# Patient Record
Sex: Female | Born: 1940
Health system: Southern US, Community
[De-identification: ages and names within clinical notes are randomized; demographics above are authoritative.]

## PROBLEM LIST (undated history)

## (undated) DIAGNOSIS — C801 Malignant (primary) neoplasm, unspecified: Secondary | ICD-10-CM

## (undated) DIAGNOSIS — C50919 Malignant neoplasm of unspecified site of unspecified female breast: Secondary | ICD-10-CM

## (undated) DIAGNOSIS — M1990 Unspecified osteoarthritis, unspecified site: Secondary | ICD-10-CM

## (undated) DIAGNOSIS — K219 Gastro-esophageal reflux disease without esophagitis: Secondary | ICD-10-CM

## (undated) DIAGNOSIS — K529 Noninfective gastroenteritis and colitis, unspecified: Secondary | ICD-10-CM

## (undated) DIAGNOSIS — K589 Irritable bowel syndrome without diarrhea: Secondary | ICD-10-CM

## (undated) HISTORY — PX: ABDOMINAL HYSTERECTOMY: SHX81

## (undated) HISTORY — PX: BREAST SURGERY: SHX581

## (undated) HISTORY — PX: UPPER GI ENDOSCOPY: SHX6162

## (undated) HISTORY — PX: MELANOMA EXCISION: SHX5266

## (undated) HISTORY — PX: TONSILLECTOMY: SUR1361

## (undated) HISTORY — PX: COLONOSCOPY: SHX174

## (undated) HISTORY — DX: Malignant neoplasm of unspecified site of unspecified female breast: C50.919

---

## 1988-05-12 HISTORY — PX: MASTECTOMY: SHX3

## 1998-09-14 ENCOUNTER — Other Ambulatory Visit: Admission: RE | Admit: 1998-09-14 | Discharge: 1998-09-14 | Payer: Self-pay | Admitting: Gynecology

## 1999-12-12 ENCOUNTER — Other Ambulatory Visit: Admission: RE | Admit: 1999-12-12 | Discharge: 1999-12-12 | Payer: Self-pay | Admitting: Gynecology

## 2001-03-17 ENCOUNTER — Ambulatory Visit (HOSPITAL_COMMUNITY): Admission: RE | Admit: 2001-03-17 | Discharge: 2001-03-17 | Payer: Self-pay | Admitting: General Surgery

## 2001-03-17 ENCOUNTER — Encounter: Payer: Self-pay | Admitting: General Surgery

## 2001-06-15 ENCOUNTER — Other Ambulatory Visit: Admission: RE | Admit: 2001-06-15 | Discharge: 2001-06-15 | Payer: Self-pay | Admitting: General Surgery

## 2001-06-24 ENCOUNTER — Other Ambulatory Visit: Admission: RE | Admit: 2001-06-24 | Discharge: 2001-06-24 | Payer: Self-pay | Admitting: Gynecology

## 2002-02-27 ENCOUNTER — Emergency Department (HOSPITAL_COMMUNITY): Admission: EM | Admit: 2002-02-27 | Discharge: 2002-02-27 | Payer: Self-pay | Admitting: Emergency Medicine

## 2002-05-18 ENCOUNTER — Ambulatory Visit (HOSPITAL_COMMUNITY): Admission: RE | Admit: 2002-05-18 | Discharge: 2002-05-18 | Payer: Self-pay | Admitting: General Surgery

## 2002-05-18 ENCOUNTER — Encounter: Payer: Self-pay | Admitting: General Surgery

## 2003-01-19 ENCOUNTER — Ambulatory Visit (HOSPITAL_COMMUNITY): Admission: RE | Admit: 2003-01-19 | Discharge: 2003-01-19 | Payer: Self-pay | Admitting: Internal Medicine

## 2003-01-20 ENCOUNTER — Encounter: Payer: Self-pay | Admitting: Internal Medicine

## 2003-05-25 ENCOUNTER — Encounter (INDEPENDENT_AMBULATORY_CARE_PROVIDER_SITE_OTHER): Payer: Self-pay | Admitting: *Deleted

## 2003-05-25 ENCOUNTER — Ambulatory Visit (HOSPITAL_COMMUNITY): Admission: RE | Admit: 2003-05-25 | Discharge: 2003-05-25 | Payer: Self-pay | Admitting: General Surgery

## 2003-05-25 ENCOUNTER — Ambulatory Visit (HOSPITAL_BASED_OUTPATIENT_CLINIC_OR_DEPARTMENT_OTHER): Admission: RE | Admit: 2003-05-25 | Discharge: 2003-05-25 | Payer: Self-pay | Admitting: General Surgery

## 2003-06-05 ENCOUNTER — Ambulatory Visit (HOSPITAL_COMMUNITY): Admission: RE | Admit: 2003-06-05 | Discharge: 2003-06-05 | Payer: Self-pay | Admitting: Internal Medicine

## 2003-06-19 ENCOUNTER — Other Ambulatory Visit: Admission: RE | Admit: 2003-06-19 | Discharge: 2003-06-19 | Payer: Self-pay | Admitting: Gynecology

## 2003-07-27 ENCOUNTER — Encounter (INDEPENDENT_AMBULATORY_CARE_PROVIDER_SITE_OTHER): Payer: Self-pay | Admitting: General Surgery

## 2003-07-27 ENCOUNTER — Ambulatory Visit (HOSPITAL_BASED_OUTPATIENT_CLINIC_OR_DEPARTMENT_OTHER): Admission: RE | Admit: 2003-07-27 | Discharge: 2003-07-27 | Payer: Self-pay | Admitting: General Surgery

## 2003-07-27 ENCOUNTER — Ambulatory Visit (HOSPITAL_COMMUNITY): Admission: RE | Admit: 2003-07-27 | Discharge: 2003-07-27 | Payer: Self-pay | Admitting: General Surgery

## 2003-11-24 ENCOUNTER — Ambulatory Visit (HOSPITAL_COMMUNITY): Admission: RE | Admit: 2003-11-24 | Discharge: 2003-11-24 | Payer: Self-pay | Admitting: Internal Medicine

## 2004-06-03 ENCOUNTER — Ambulatory Visit: Payer: Self-pay | Admitting: Internal Medicine

## 2004-06-26 ENCOUNTER — Emergency Department (HOSPITAL_COMMUNITY): Admission: EM | Admit: 2004-06-26 | Discharge: 2004-06-26 | Payer: Self-pay | Admitting: Emergency Medicine

## 2004-06-27 ENCOUNTER — Encounter: Admission: RE | Admit: 2004-06-27 | Discharge: 2004-06-27 | Payer: Self-pay | Admitting: Urology

## 2004-07-03 ENCOUNTER — Ambulatory Visit (HOSPITAL_BASED_OUTPATIENT_CLINIC_OR_DEPARTMENT_OTHER): Admission: RE | Admit: 2004-07-03 | Discharge: 2004-07-03 | Payer: Self-pay | Admitting: Urology

## 2004-07-03 ENCOUNTER — Ambulatory Visit (HOSPITAL_COMMUNITY): Admission: RE | Admit: 2004-07-03 | Discharge: 2004-07-03 | Payer: Self-pay | Admitting: Urology

## 2004-07-22 ENCOUNTER — Ambulatory Visit (HOSPITAL_COMMUNITY): Admission: RE | Admit: 2004-07-22 | Discharge: 2004-07-22 | Payer: Self-pay | Admitting: Urology

## 2004-07-29 ENCOUNTER — Ambulatory Visit: Payer: Self-pay | Admitting: Internal Medicine

## 2004-12-10 ENCOUNTER — Ambulatory Visit (HOSPITAL_COMMUNITY): Admission: RE | Admit: 2004-12-10 | Discharge: 2004-12-10 | Payer: Self-pay | Admitting: Internal Medicine

## 2005-04-22 ENCOUNTER — Ambulatory Visit (HOSPITAL_COMMUNITY): Admission: RE | Admit: 2005-04-22 | Discharge: 2005-04-22 | Payer: Self-pay | Admitting: Internal Medicine

## 2005-04-22 ENCOUNTER — Ambulatory Visit: Payer: Self-pay | Admitting: Internal Medicine

## 2005-04-22 ENCOUNTER — Encounter (INDEPENDENT_AMBULATORY_CARE_PROVIDER_SITE_OTHER): Payer: Self-pay | Admitting: Internal Medicine

## 2005-06-16 ENCOUNTER — Ambulatory Visit: Payer: Self-pay | Admitting: Orthopedic Surgery

## 2005-06-25 ENCOUNTER — Ambulatory Visit: Payer: Self-pay | Admitting: Internal Medicine

## 2005-10-02 ENCOUNTER — Other Ambulatory Visit: Admission: RE | Admit: 2005-10-02 | Discharge: 2005-10-02 | Payer: Self-pay | Admitting: Gynecology

## 2005-11-17 ENCOUNTER — Encounter (HOSPITAL_COMMUNITY): Admission: RE | Admit: 2005-11-17 | Discharge: 2005-11-18 | Payer: Self-pay | Admitting: Internal Medicine

## 2005-12-11 ENCOUNTER — Ambulatory Visit (HOSPITAL_COMMUNITY): Admission: RE | Admit: 2005-12-11 | Discharge: 2005-12-11 | Payer: Self-pay | Admitting: Internal Medicine

## 2007-01-25 ENCOUNTER — Ambulatory Visit (HOSPITAL_COMMUNITY): Admission: RE | Admit: 2007-01-25 | Discharge: 2007-01-25 | Payer: Self-pay | Admitting: Internal Medicine

## 2007-03-09 ENCOUNTER — Ambulatory Visit (HOSPITAL_COMMUNITY): Admission: RE | Admit: 2007-03-09 | Discharge: 2007-03-09 | Payer: Self-pay | Admitting: Internal Medicine

## 2008-01-27 ENCOUNTER — Ambulatory Visit (HOSPITAL_COMMUNITY): Admission: RE | Admit: 2008-01-27 | Discharge: 2008-01-27 | Payer: Self-pay | Admitting: Internal Medicine

## 2009-01-30 ENCOUNTER — Ambulatory Visit (HOSPITAL_COMMUNITY): Admission: RE | Admit: 2009-01-30 | Discharge: 2009-01-30 | Payer: Self-pay | Admitting: Internal Medicine

## 2010-01-25 ENCOUNTER — Ambulatory Visit: Payer: Self-pay | Admitting: Cardiology

## 2010-01-25 DIAGNOSIS — K589 Irritable bowel syndrome without diarrhea: Secondary | ICD-10-CM

## 2010-01-25 DIAGNOSIS — M129 Arthropathy, unspecified: Secondary | ICD-10-CM | POA: Insufficient documentation

## 2010-01-25 DIAGNOSIS — C50919 Malignant neoplasm of unspecified site of unspecified female breast: Secondary | ICD-10-CM | POA: Insufficient documentation

## 2010-01-25 DIAGNOSIS — K580 Irritable bowel syndrome with diarrhea: Secondary | ICD-10-CM | POA: Insufficient documentation

## 2010-01-25 DIAGNOSIS — R002 Palpitations: Secondary | ICD-10-CM | POA: Insufficient documentation

## 2010-01-25 DIAGNOSIS — R079 Chest pain, unspecified: Secondary | ICD-10-CM | POA: Insufficient documentation

## 2010-01-25 DIAGNOSIS — Z9010 Acquired absence of unspecified breast and nipple: Secondary | ICD-10-CM

## 2010-01-29 ENCOUNTER — Encounter: Payer: Self-pay | Admitting: Cardiology

## 2010-01-29 ENCOUNTER — Ambulatory Visit: Payer: Self-pay | Admitting: Cardiology

## 2010-01-29 ENCOUNTER — Ambulatory Visit (HOSPITAL_COMMUNITY): Admission: RE | Admit: 2010-01-29 | Discharge: 2010-01-29 | Payer: Self-pay | Admitting: Cardiology

## 2010-02-01 ENCOUNTER — Ambulatory Visit (HOSPITAL_COMMUNITY)
Admission: RE | Admit: 2010-02-01 | Discharge: 2010-02-01 | Payer: Self-pay | Source: Home / Self Care | Admitting: Internal Medicine

## 2010-04-29 ENCOUNTER — Encounter (INDEPENDENT_AMBULATORY_CARE_PROVIDER_SITE_OTHER): Payer: Self-pay

## 2010-06-02 ENCOUNTER — Encounter: Payer: Self-pay | Admitting: Internal Medicine

## 2010-06-11 ENCOUNTER — Ambulatory Visit (HOSPITAL_COMMUNITY)
Admission: RE | Admit: 2010-06-11 | Discharge: 2010-06-11 | Payer: Self-pay | Source: Home / Self Care | Attending: Internal Medicine | Admitting: Internal Medicine

## 2010-06-11 NOTE — Assessment & Plan Note (Signed)
Summary: np6/ rapid heart rate. palps/ pt has secure horizone. gd   Visit Type:  new pt visit Referring Provider:  Valera Castle Primary Provider:  Carylon Perches  CC:  palpitations....pt states she had an episode of heat stroke back in the summer while getting ready to play golf....pt c/o chest tightness...denies any sob or edema...says her activity level is still the same and is eating well..says she is not sleeping well lately.  History of Present Illness: Sonya [name] comes in today self referred by her husband for some palpitations and chest discomfort.  She is 70 years of age and has no cardiac history. She is extremely health-conscious. She did smoke for a number of years but quit in 1988.  This summer, while playing golf, she apparently got dehydrated. She describes what happened later was a vasovagal syncopal event. It took her several days to count recover from this. This was not facilitated by chest pain or any symptoms of tachycardia or palpitations.  Ever since then, she has intermittent sporadic brief palpitations. There no other associated symptoms. She also occasionally has some aching across her left chest. She has an old shoulder injury on the left. This discomfort is not related to exertion. She has no other symptoms of shortness of breath, diaphoresis or palpitations with the chest pain. It happens fairly infrequent.  Her cardiac risk factors are significant for age and remote tobacco. Her cholesterol numbers by Dr. Ouida Sills show a total of 191, HDL 78, total cholesterol today she'll ratio 2.4, triglycerides were normal LDL was 94. LFTs are normal.  Her blood work also showed a normal potassium, normal fasting blood sugar. I do not see a TSH. She denies any symptoms of hyperthyroidism.  Preventive Screening-Counseling & Management  Alcohol-Tobacco     Smoking Status: quit  Caffeine-Diet-Exercise     Does Patient Exercise: yes      Drug Use:  no.    Current Medications  (verified): 1)  Aspirin 81 Mg Tbec (Aspirin) .... Take One Tablet By Mouth Daily 2)  Evista 60 Mg Tabs (Raloxifene Hcl) .Marland Kitchen.. 1 Tab Once Daily  Allergies (verified): 1)  ! Celebrex  Past History:  Family History: Last updated: 01/25/2010 Mother: deceaed @ 80 due to cancer Father: deceased @ 44 due to alzheimers  Social History: Last updated: 01/25/2010 Tobacco Use - Former.  smoked for 20 yrs ...quit 1988 Married  Alcohol Use - yes..2 glasses per night Drug Use - no Regular Exercise - yes...golf, walking, swimming  Risk Factors: Exercise: yes (01/25/2010)  Risk Factors: Smoking Status: quit (01/25/2010)  Past Medical History: Chest pains Palpatations Heat stroke in July 2011 Allergies  Past Surgical History:  radical mastectomy  a hysterectomy and unilateral oophorectomy in 1990, with a  hemorrhoidectomy, and she has had a previous bladder tack.  Cysto, left retrograde pyelogram and insertion of left ureteral  stent. Tonsillectomy Adenenoidectomy Hemorrhoidectomy  Family History: Mother: deceaed @ 31 due to cancer Father: deceased @ 76 due to alzheimers  Social History: Tobacco Use - Former.  smoked for 20 yrs ...quit 1988 Married  Alcohol Use - yes..2 glasses per night Drug Use - no Regular Exercise - yes...golf, walking, swimming Smoking Status:  quit Drug Use:  no Does Patient Exercise:  yes  Review of Systems       negative other than history of present illness  Vital Signs:  Patient profile:   70 year old female Height:      65 inches Weight:  137.8 pounds BMI:     23.01 Pulse rate:   71 / minute Pulse rhythm:   irregular BP sitting:   116 / 80  (right arm) Cuff size:   large  Vitals Entered By: Danielle Rankin, CMA (January 25, 2010 12:31 PM)  Physical Exam  General:  Well developed, well nourished, in no acute distress. Head:  normocephalic and atraumatic Eyes:  PERRLA/EOM intact; conjunctiva and lids normal. Neck:  Neck supple,  no JVD. No masses, thyromegaly or abnormal cervical nodes. Chest Wall:  no deformities or breast masses noted Lungs:  Clear bilaterally to auscultation and percussion. Heart:  Non-displaced PMI, chest non-tender; regular rate and rhythm, S1, S2 without murmurs, rubs or gallops. Carotid upstroke normal, no bruit. Normal abdominal aortic size, no bruits. Femorals normal pulses, no bruits. Pedals normal pulses. No edema, no varicosities. Abdomen:  Bowel sounds positive; abdomen soft and non-tender without masses, organomegaly, or hernias noted. No hepatosplenomegaly. Msk:  Back normal, normal gait. Muscle strength and tone normal. Pulses:  pulses normal in all 4 extremities Extremities:  No clubbing or cyanosis. Neurologic:  Alert and oriented x 3. Skin:  Intact without lesions or rashes. Psych:  Normal affect.   Problems:  Medical Problems Added: 1)  Dx of Chest Pain  (ICD-786.50) 2)  Dx of Palpitations  (ICD-785.1) 3)  Dx of Arthritis  (ICD-716.90) 4)  Dx of Ibs  (ICD-564.1) 5)  Dx of Mastectomy, Left, Hx of  (ICD-V45.71) 6)  Dx of Adenocarcinoma, Left Breast  (ICD-174.9)  EKG  Procedure date:  01/25/2010  Findings:      normal sinus rhythm, normal EKG except for some slight poor progression anterior precordium. P-R QRS and QTC intervals are normal  Impression & Recommendations:  Problem # 1:  CHEST PAIN (ICD-786.50) I do not think this is cardiac. I suspect is musculoskeletal somehow associated with her left shoulder discomfort. I have reviewed the symptoms of angina or ischemic equivalence. I encouraged continued exercise and continue her heart healthy lifestyle. Her updated medication list for this problem includes:    Aspirin 81 Mg Tbec (Aspirin) .Marland Kitchen... Take one tablet by mouth daily  Orders: EKG w/ Interpretation (93000)  Problem # 2:  PALPITATIONS (ICD-785.1) I suspect these are benign with a fairly benign history, normal exam, normal EKG. We'll arrange a 2-D  echocardiogram. This is normal reassurance and be given. Her updated medication list for this problem includes:    Aspirin 81 Mg Tbec (Aspirin) .Marland Kitchen... Take one tablet by mouth daily  Orders: EKG w/ Interpretation (93000) Echocardiogram (Echo)  Patient Instructions: 1)  Your physician recommends that you schedule a follow-up appointment in: as needed with Dr. Daleen Squibb 2)  Your physician recommends that you continue on your current medications as directed. Please refer to the Current Medication list given to you today. 3)  Your physician has requested that you have an echocardiogram.  Echocardiography is a painless test that uses sound waves to create images of your heart. It provides your doctor with information about the size and shape of your heart and how well your heart's chambers and valves are working.  This procedure takes approximately one hour. There are no restrictions for this procedure. in [place].

## 2010-06-13 NOTE — Letter (Signed)
Summary: Recall, Screening Colonoscopy Only  [facility]  [address]   [place], [place] [address]   Phone: [phone]  Fax: [phone]    April 29, 2010  [facility] [address] [place], [place]  [address] [birth year]   Dear Ms. Wellen,   Our records indicate it is time to schedule your colonoscopy.    Please call our office at [phone] and ask for the nurse.   Thank you,  Hendricks Limes, LPN Cloria Spring, LPN  [facility] Ph: [phone]   Fax: [phone]

## 2010-07-25 ENCOUNTER — Ambulatory Visit (HOSPITAL_COMMUNITY)
Admission: RE | Admit: 2010-07-25 | Discharge: 2010-07-25 | Disposition: A | Discharge status: Home / Self Care | Payer: Medicare Other | Source: Ambulatory Visit | Attending: Internal Medicine | Admitting: Internal Medicine

## 2010-07-25 ENCOUNTER — Encounter (HOSPITAL_BASED_OUTPATIENT_CLINIC_OR_DEPARTMENT_OTHER): Payer: Medicare Other | Admitting: Internal Medicine

## 2010-07-25 DIAGNOSIS — Z800 Family history of malignant neoplasm of digestive organs: Secondary | ICD-10-CM

## 2010-07-25 DIAGNOSIS — K644 Residual hemorrhoidal skin tags: Secondary | ICD-10-CM | POA: Insufficient documentation

## 2010-07-25 DIAGNOSIS — Z1211 Encounter for screening for malignant neoplasm of colon: Secondary | ICD-10-CM

## 2010-07-25 DIAGNOSIS — K5730 Diverticulosis of large intestine without perforation or abscess without bleeding: Secondary | ICD-10-CM

## 2010-07-25 DIAGNOSIS — Z86010 Personal history of colonic polyps: Secondary | ICD-10-CM

## 2010-07-25 DIAGNOSIS — IMO0001 Reserved for inherently not codable concepts without codable children: Secondary | ICD-10-CM | POA: Insufficient documentation

## 2010-08-13 NOTE — Op Note (Signed)
  NAME:  Sonya [name], HELVIE             ACCOUNT NO.:  [id number]  MEDICAL RECORD NO.:  [id number]           PATIENT TYPE:  O  LOCATION:  DAYP                          FACILITY:  [facility]  PHYSICIAN:  Lionel December, M.D.    DATE OF BIRTH:  [birth year]  DATE OF PROCEDURE:  07/25/2010 DATE OF DISCHARGE:  07/25/2010                              OPERATIVE REPORT   PROCEDURE:  Colonoscopy.  INDICATIONS:  [name] is a 70 year old Caucasian female who is here for high-risk screening colonoscopy.  Last colonoscopy was about 5 years ago with removal of 2 cecal polyps.  They were non adenomatous.  Her family history is positive for colon carcinoma in her mother at age 41, maternal grandmother had died of colon carcinoma at age 61 and maternal grandfather also had colon carcinoma and it was in his 67s.  Procedures were reviewed with the patient.  Informed consent was obtained.  MEDICATIONS FOR CONSCIOUS SEDATION: 1. Demerol 50 mg IV. 2. Versed 6 mg IV.  FINDINGS:  Procedure performed in endoscopy suite.  The patient's vital signs and O2 sat were monitored during the procedure and remained stable.  The patient was placed in left lateral recumbent position. Rectal examination performed.  No abnormality noted on external or digital exam.  Pentax videoscope was placed through rectum and advanced under vision into sigmoid colon, which was somewhat tortuous.  Once the scope was passed, the splenic flexure was easily passed into cecum which was identified by appendiceal orifice and ileocecal valve.  Pictures were taken for the record.  Few scattered diverticula were noted throughout the colon.  As the scope was withdrawn, colonic mucosa was carefully examined and no polyps and/or tumor masses were noted.  Rectal mucosa was normal.  Scope was retroflexed to examine anorectal junction and small hemorrhoids noted below the dentate line.  Endoscope was straightened and withdrawn.  The patient tolerated the  procedure well.  FINAL DIAGNOSES: 1. Examination performed to cecum. 2. Few scattered diverticula throughout the colon. 3. Small external hemorrhoids.  RECOMMENDATIONS:  Standard instructions given.  She should continue with her high-fiber diet and consider next exam in 5 years.     Lionel December, M.D.     NR/MEDQ  D:  07/25/2010  T:  07/25/2010  Job:  [id number]  cc:   Kingsley Callander. Ouida Sills, MD Fax: [phone]  Electronically Signed by Lionel December M.D. on 08/13/2010 10:38:28 AM

## 2010-09-11 ENCOUNTER — Other Ambulatory Visit (HOSPITAL_COMMUNITY): Payer: Self-pay | Admitting: General Surgery

## 2010-09-11 ENCOUNTER — Ambulatory Visit (HOSPITAL_COMMUNITY)
Admission: RE | Admit: 2010-09-11 | Discharge: 2010-09-11 | Disposition: A | Discharge status: Home / Self Care | Payer: Medicare Other | Source: Ambulatory Visit | Attending: General Surgery | Admitting: General Surgery

## 2010-09-11 DIAGNOSIS — M25579 Pain in unspecified ankle and joints of unspecified foot: Secondary | ICD-10-CM | POA: Insufficient documentation

## 2010-09-11 DIAGNOSIS — R937 Abnormal findings on diagnostic imaging of other parts of musculoskeletal system: Secondary | ICD-10-CM | POA: Insufficient documentation

## 2010-09-11 DIAGNOSIS — R609 Edema, unspecified: Secondary | ICD-10-CM

## 2010-09-27 NOTE — Procedures (Signed)
NAME:  BRISSIA, DELISA             ACCOUNT NO.:  [id number]   MEDICAL RECORD NO.:  [id number]           PATIENT TYPE:  REC   LOCATION:                                FACILITY:  [facility]   PHYSICIAN:  Kingsley Callander. Ouida Sills, MD       DATE OF BIRTH:  [birth year]   DATE OF PROCEDURE:  11/17/2005  DATE OF DISCHARGE:                                    STRESS TEST   Stress Myoview   The patient underwent a Myoview stress test to evaluate recent symptoms of  chest pain.  She exercised 9 minutes (completing stage III of the Bruce  protocol), obtaining a maximal heart rate of 155 (99% of the age-predicted  maximal heart rate) at a work load of 10.1 METS and discontinued exercise  after surpassing her target heart rate.  There were no symptoms of chest  pain.  There were no arrhythmias.  There were no ST segment changes  diagnostic of ischemia.  The baseline electrocardiogram revealed normal  sinus rhythm at 81 beats per minute.   IMPRESSION:  No evidence of exercise-induced ischemia.  Myoview images  pending.      Kingsley Callander. Ouida Sills, MD  Electronically Signed     ROF/MEDQ  D:  11/17/2005  T:  11/17/2005  Job:  [id number]

## 2010-09-27 NOTE — Op Note (Signed)
NAME:  Sonya [name], Sonya [name]             ACCOUNT NO.:  [id number]   MEDICAL RECORD NO.:  [id number]          PATIENT TYPE:  AMB   LOCATION:  DAY                           FACILITY:  [facility]   PHYSICIAN:  Lionel December, M.D.    DATE OF BIRTH:  02-18-1941   DATE OF PROCEDURE:  04/22/2005  DATE OF DISCHARGE:                                 OPERATIVE REPORT   PROCEDURE:  Colonoscopy.   INDICATION:  [name] is a 70 year old Caucasian female who is here for high-risk  screening colonoscopy. Family history is positive for colon carcinoma in her  mother at age 49. Personal history is significant for history of breast  carcinoma for which she had surgery and chemo 17 years ago and remains in  remission. Procedure and risks were reviewed with the patient, and informed  consent was obtained.   MEDICINES FOR CONSCIOUS SEDATION:  Demerol 50 mg IV, Versed 8 mg IV in  divided dose.   FINDINGS:  Procedure performed in endoscopy suite. The patient's vital signs  and O2 saturation were monitored during the procedure and remained stable.  The patient was placed in left lateral position. Rectal examination  performed. No abnormality noted on external or digital exam. Olympus  videoscope was placed in rectum and advanced under vision into sigmoid colon  and beyond. Preparation was excellent. Few scattered diverticula were noted  at sigmoid and descending colon. Scope was passed to cecum which was  identified by ileocecal valve. There were two polyps involving the blunt-end  cecum. One was to the left of the appendiceal orifice. This was completely  ablated via biopsy and was about 2.4 mm. Another one on the other site was  much smaller. Both of these were submitted in one container. As the scope  was withdrawn, colonic mucosa was examined for the second time and no other  mucosal abnormalities were noted. Rectal mucosa was normal. Scope was  retroflexed to examine anorectal junction which was unremarkable.  Endoscope  was straightened and withdrawn. The patient tolerated the procedure well.   FINAL DIAGNOSIS:  1.  Two small cecal polyps which were ablated via cold biopsy (one      container).  2.  Few diverticula at the left sigmoid and descending colon.   RECOMMENDATIONS:  Standard instructions given. I would like for her to try  to add fiber supplement such as FiberChoice 1-2 tablets daily.   I will be contacting the patient with biopsy results and further  recommendations. She possibly will need to return for follow-up exam in five  years from now.      Lionel December, M.D.  Electronically Signed     NR/MEDQ  D:  04/22/2005  T:  04/22/2005  Job:  [id number]   cc:   Kingsley Callander. Ouida Sills, MD  Fax: [phone]

## 2010-09-27 NOTE — Consult Note (Signed)
NAME:  Sonya [name], Sonya [name]             ACCOUNT NO.:  [id number]   MEDICAL RECORD NO.:  [id number]          PATIENT TYPE:  EMS   LOCATION:  ED                            FACILITY:  [facility]   PHYSICIAN:  Barbaraann Barthel, M.D. DATE OF BIRTH:  21-Jul-1940   DATE OF CONSULTATION:  06/26/2004  DATE OF DISCHARGE:                                   CONSULTATION   ADDENDUM:  Past surgeries included a left modified radical mastectomy 15  years ago, a hysterectomy and unilateral oophorectomy in 1973, with a  hemorrhoidectomy, and she has had a previous bladder tack.   The patient is allergic to CELEBREX.   Her medicines include Levsin and Evista.      WB/MEDQ  D:  06/26/2004  T:  06/26/2004  Job:  [id number]

## 2010-09-27 NOTE — Consult Note (Signed)
NAME:  Sonya [name], Sonya [name] NO.:  [id number]   MEDICAL RECORD NO.:  [id number]          PATIENT TYPE:  EMS   LOCATION:  ED                            FACILITY:  [facility]   PHYSICIAN:  Barbaraann Barthel, M.D. DATE OF BIRTH:  02-22-1941   DATE OF CONSULTATION:  06/26/2004  DATE OF DISCHARGE:                                   CONSULTATION   Surgery was asked to see this 70 year old white female who came to the  emergency room with abdominal pain.   CHIEF COMPLAINT:  Crampy abdominal pain, primarily in the right lower  quadrant.   HISTORY OF PRESENT ILLNESS:  The patient states that she has had this pain  on and off for the last week or so, approximately two weeks of this  discomfort. This has been unaccompanied with any other noticeable GI  symptoms such as nausea or vomiting or loss of appetite. She has also had no  change in her bowel habits. No diarrhea, black tarry stools, or bright red  rectal bleeding.   In the past in 2003 or thereabouts, she had a colonoscopy. She was found to  have diverticulosis. No other abnormalities were encountered. The patient  has been treated in the past by the GI service for irritable bowel syndrome.   This patient felt uncomfortable last night but had, as I stated, no  particular GI symptoms. She did have lower back pain with this as well, but  this was unaccompanied with any CV tenderness per se or any dysuria. She  came to the emergency room. She is an old patient of mine and requested my  attention.   PHYSICAL EXAMINATION:  GENERAL:  Physical exam discloses a pleasant 63-year-  old white female in no acute distress. She weighs 156 pounds. She is 5 foot  6 inches. She is afebrile. Temperature of 97.1, pulse rate 95, respirations  18, blood pressure 122/70. O2 saturation was 99%.  HEENT:  Head is normocephalic. Extraocular movements are intact. Pupils are  round and reactive to light and accommodation. There is no conjunctival  pallor or scleral injection. Sclerae is a normal tincture. Nose and oral  mucosa are moist.  NECK:  Supple and cylinder without jugular venous distention, thyromegaly,  tracheal deviation or cervical adenopathy. No bruits are auscultated.  CHEST:  Clear both to anterior and posterior auscultation.  HEART:  Regular rhythm. The patient has had a previous left modified radical  mastectomy for adenocarcinoma of the breast approximately 15 years ago.  ABDOMEN:  Soft. Bowel sounds are present. The patient ate breakfast and is  hungry at present. Bowel sounds are normal active. There are no femoral or  inguinal hernias appreciated. No masses appreciated. No visceromegaly.  RECTAL:  Guaiac negative stool.  EXTREMITIES:  Within normal limits.   REVIEW OF SYSTEMS:  GASTROINTESTINAL:  The patient has a history of  irritable bowel syndrome. Colonoscopy done in the early 2000s which was  normal except for some mild diverticulosis according to her report. No  history of weight loss. No history of bright red rectal bleeding, black  tarry, stools, melena, or  nausea or vomiting. GENITOURINARY:  No history of  nephrolithiasis. No history of dysuria. ENDOCRINE:  No history of diabetes  or thyroid disease. CARDIOVASCULAR:  Within normal limits. The patient is a  nonsmoker at present. MUSCULOSKELETAL:  Within normal limits. She is  athletic woman with no acute problems.   MEDICATIONS:  She takes Evista. Other than that, she takes no medications on  a regular basis. The patient has been ordered Levsin or Bentyl, one of those  antispasmodic medications for her irritable bowel syndrome in the past.   ALLERGIES:  Has no known allergies.   PAST SURGICAL HISTORY:  1.  Tonsillectomy and adenoidectomy.  2.  Left modified radical mastectomy.   OBSTETRICAL/GYNECOLOGICAL HISTORY:  She is a gravida 3, para 3, abortus 0,  Cesarean 0 female who has had history of adenocarcinoma of the breast  treated with  chemotherapy and is followed by [facility] as well.   LABORATORY DATA:  All laboratory data is pending. Urinalysis appears to be  grossly within normal limits and nonspecific.   IMPRESSION:  Possible irritable bowel syndrome, possible diverticulitis.   PLAN:  Will obtain a CT scan of the abdomen with contrast. I will follow  this patient in the emergency room and see what these studies show to assess  her later for final disposition.      WB/MEDQ  D:  06/26/2004  T:  06/26/2004  Job:  [id number]   cc:   Kingsley Callander. Ouida Sills, MD  [address]  [place]  [place] [address]  Fax: [phone]   Lionel December, M.D.  [address]  [place]  [place] [address]

## 2010-09-27 NOTE — Consult Note (Signed)
NAME:  Sonya [name], Sonya [name]             ACCOUNT NO.:  [id number]   MEDICAL RECORD NO.:  [id number]          PATIENT TYPE:  EMS   LOCATION:  ED                            FACILITY:  [facility]   PHYSICIAN:  Barbaraann Barthel, M.D. DATE OF BIRTH:  Nov 27, 1940   DATE OF CONSULTATION:  06/26/2004  DATE OF DISCHARGE:                                   CONSULTATION   FOLLOWUP NOTE:  This patient was seen in the emergency room in followup. CT  scan has shown her to have an approximal third of her left ureteral what  appears to be extrinsic obstruction of the ureter with some mild  hydronephrotic changes. There is not complete obstruction, but there is  definite delay of emptying of contrast material into the bladder with an  area of obstruction identified. There is also a stone noted in the left  kidney. This is consistent with her back pain that really on my clinical  examination was a little more prominent than actually her abdominal  findings.   At any rate, I have contacted Dr. Aldean Ast, Dr. Jinny Sanders partner, who has  agreed to see her at 9 a.m. tomorrow on June 27, 2004.      WB/MEDQ  D:  06/26/2004  T:  06/26/2004  Job:  [id number]   cc:   Earlene Plater, M.D  [facility] O. Ouida Sills, MD  [address]  [place]  [place] [address]  Fax: [phone]   Lionel December, M.D.  [address]  [place]  [place] [address]

## 2010-09-27 NOTE — Op Note (Signed)
NAME:  Sonya [name], Sonya [name]             ACCOUNT NO.:  [id number]   MEDICAL RECORD NO.:  [id number]          PATIENT TYPE:  AMB   LOCATION:  [facility]                         FACILITY:  [facility]   PHYSICIAN:  Rozanna Boer., M.D.DATE OF BIRTH:  1940-07-19   DATE OF PROCEDURE:  07/03/2004  DATE OF DISCHARGE:                                 OPERATIVE REPORT   PREOPERATIVE DIAGNOSES:  Right flank pain, left renal calculus, left  hydronephrosis.   POSTOPERATIVE DIAGNOSES:  Right flank pain, left renal calculus, left  hydronephrosis.  Probable ureteropelvic junction obstruction.   PROCEDURE:  Cysto, left retrograde pyelogram and insertion of left ureteral  stent.   ANESTHESIA:  General.   SURGEON:  Courtney Paris, M.D.   BRIEF HISTORY:  This 70 year old lady presented with some right flank pain  she had had off and on for several days. She had some microscopic hematuria  and a CT scan showed a left renal stone 6 x 4 mm, left hydronephrosis and  there seemed to be an obstruction of the left upper ureter. She has a  history of breast cancer without recurrence in 1990 and enters now for  Cysto, retrograde pyelogram and determine of the cause of the left  hydronephrosis.   The patient was placed on the operating table in dorsal lithotomy position  and after satisfactory induction of general anesthesia was prepped and  draped with Betadine in the usual sterile fashion. A panendoscope, 21 [place]  was inserted and the bladder inspected, no bladder mucosal lesions seen,  trigone and orifices looked normal. Mild 0-1+ trabeculations were noted. The  left orifice was catheterized with the 5 open ended ureteral catheter and  occlusive retrograde was done that showed a normal ureter up to the UP  junction where it took a sharp medial turn with an S shaped curve going into  a very dilated renal pelvis. Pictures were made to confirm this. A stone was  seen within the renal pelvis and it  corresponded to the one seen on previous  x-ray.  A Glidewire was passed through the open ended catheter under  fluoroscopy up to the kidney and then the open ended catheter was then  advanced. The Glidewire was removed and a guidewire was inserted and the  open ended catheter removed.  Over the guidewire, a 6 French x 26 cm length  double J ureteral stent was passed under fluoroscopy. The coil was in the  renal pelvis and one in the bladder in a satisfactory manner. The guidewire  was then removed and the bladder drained, scope removed. The patient given a  B&O suppository and taken to the recovery room in good condition.   PLAN:  See her back in a week or so. She might need lithotripsy of the stone  and then probable assessment of whether or not the UP junction would need to  be addressed as well.  The patient tolerated the procedure well and returned  to the recovery room in good condition to be discharged with detailed  written instructions.     HMK/MEDQ  D:  07/03/2004  T:  07/03/2004  Job:  [id number]

## 2010-09-27 NOTE — Op Note (Signed)
NAME:  Goodgame, Cortnie L                       ACCOUNT NO.:  [id number]   MEDICAL RECORD NO.:  [id number]                   PATIENT TYPE:  AMB   LOCATION:  DSC                                  FACILITY:  [facility]   PHYSICIAN:  Rose Phi. Maple Hudson, M.D.                DATE OF BIRTH:  25-May-1940   DATE OF PROCEDURE:  05/25/2003  DATE OF DISCHARGE:                                 OPERATIVE REPORT   PREOPERATIVE DIAGNOSIS:  Melanoma of the left pretibial area.   POSTOPERATIVE DIAGNOSIS:  Melanoma of the left pretibial area.   PROCEDURE:  Excision of melanoma with primary closure.   SURGEON:  Rose Phi. Maple Hudson, M.D.   ANESTHESIA:  Local.   DESCRIPTION OF PROCEDURE:  The patient was placed on the operating table and  the left pretibial area prepped and draped.  An elliptical vertical incision  was then outlined giving about a 0.5 cm margin around the area, was then  outlined and the area infiltrated with 1% Xylocaine with Adrenaline.  Incision was made and I excised the lesion.  This gave a defect of 4 x 1.5  cm.  It was then closed with a single layer of interrupted nylon sutures.  Dressing applied and the patient then allowed to go home.                                               Rose Phi. Maple Hudson, M.D.    PRY/MEDQ  D:  05/25/2003  T:  05/25/2003  Job:  [id number]

## 2010-09-27 NOTE — Procedures (Signed)
   NAME:  [name], Sonya PENNEBAKER                       ACCOUNT NO.:  [id number]   MEDICAL RECORD NO.:  [id number]                   PATIENT TYPE:  OUT   LOCATION:  RAD                                  FACILITY:  [facility]   PHYSICIAN:  Kingsley Callander. Ouida Sills, M.D.                  DATE OF BIRTH:  [birth year]   DATE OF PROCEDURE:  01/19/2003  DATE OF DISCHARGE:                                    STRESS TEST   SUMMARY:  The patient exercised nine minutes 14 seconds (14 seconds into  stage 4 of the Bruce protocol) attaining a maximal heart rate of 166 (104%  of the age predicted maximal heart rate) at a work load of 10.1 METs and  discontinued exercise after surpassing her target heart rate.  There were no  symptoms of chest pain.  There were infrequent ventricular premature  complexes.  There were no ST segment changes diagnostic of ischemia.  The  baseline EKG revealed normal sinus rhythm at 88 beats per minute.   IMPRESSION:  No evidence of exercise-induced ischemia.  Cardiolite images  pending.                                               Kingsley Callander. Ouida Sills, M.D.    ROF/MEDQ  D:  01/19/2003  T:  01/19/2003  Job:  [id number]

## 2010-11-18 ENCOUNTER — Other Ambulatory Visit: Payer: Self-pay | Admitting: Dermatology

## 2011-01-03 ENCOUNTER — Other Ambulatory Visit (HOSPITAL_COMMUNITY): Payer: Self-pay | Admitting: Internal Medicine

## 2011-01-03 DIAGNOSIS — Z139 Encounter for screening, unspecified: Secondary | ICD-10-CM

## 2011-02-10 ENCOUNTER — Ambulatory Visit (HOSPITAL_COMMUNITY)
Admission: RE | Admit: 2011-02-10 | Discharge: 2011-02-10 | Disposition: A | Discharge status: Home / Self Care | Payer: Medicare Other | Source: Ambulatory Visit | Attending: Internal Medicine | Admitting: Internal Medicine

## 2011-02-10 DIAGNOSIS — Z1231 Encounter for screening mammogram for malignant neoplasm of breast: Secondary | ICD-10-CM | POA: Insufficient documentation

## 2011-02-10 DIAGNOSIS — Z139 Encounter for screening, unspecified: Secondary | ICD-10-CM

## 2011-05-21 ENCOUNTER — Other Ambulatory Visit: Payer: Self-pay | Admitting: Dermatology

## 2011-06-26 ENCOUNTER — Other Ambulatory Visit: Payer: Self-pay | Admitting: Dermatology

## 2011-09-16 ENCOUNTER — Other Ambulatory Visit (HOSPITAL_COMMUNITY): Payer: Self-pay | Admitting: General Surgery

## 2011-09-16 DIAGNOSIS — R110 Nausea: Secondary | ICD-10-CM

## 2011-09-16 DIAGNOSIS — R1011 Right upper quadrant pain: Secondary | ICD-10-CM

## 2011-09-19 ENCOUNTER — Other Ambulatory Visit (HOSPITAL_COMMUNITY): Payer: Self-pay | Admitting: General Surgery

## 2011-09-19 ENCOUNTER — Ambulatory Visit (HOSPITAL_COMMUNITY)
Admission: RE | Admit: 2011-09-19 | Discharge: 2011-09-19 | Disposition: A | Discharge status: Home / Self Care | Payer: Medicare Other | Source: Ambulatory Visit | Attending: General Surgery | Admitting: General Surgery

## 2011-09-19 DIAGNOSIS — R110 Nausea: Secondary | ICD-10-CM

## 2011-09-19 DIAGNOSIS — Q619 Cystic kidney disease, unspecified: Secondary | ICD-10-CM | POA: Insufficient documentation

## 2011-09-19 DIAGNOSIS — R1011 Right upper quadrant pain: Secondary | ICD-10-CM

## 2011-09-19 DIAGNOSIS — K7689 Other specified diseases of liver: Secondary | ICD-10-CM | POA: Insufficient documentation

## 2011-09-23 ENCOUNTER — Other Ambulatory Visit (HOSPITAL_COMMUNITY): Payer: Self-pay | Admitting: General Surgery

## 2011-09-23 DIAGNOSIS — C50919 Malignant neoplasm of unspecified site of unspecified female breast: Secondary | ICD-10-CM

## 2011-09-26 ENCOUNTER — Encounter (HOSPITAL_COMMUNITY)
Admission: RE | Admit: 2011-09-26 | Discharge: 2011-09-26 | Disposition: A | Discharge status: Home / Self Care | Payer: Medicare Other | Source: Ambulatory Visit | Attending: General Surgery | Admitting: General Surgery

## 2011-09-26 ENCOUNTER — Encounter (HOSPITAL_COMMUNITY): Payer: Self-pay

## 2011-09-26 DIAGNOSIS — Z853 Personal history of malignant neoplasm of breast: Secondary | ICD-10-CM | POA: Insufficient documentation

## 2011-09-26 DIAGNOSIS — R0789 Other chest pain: Secondary | ICD-10-CM | POA: Insufficient documentation

## 2011-09-26 DIAGNOSIS — C50919 Malignant neoplasm of unspecified site of unspecified female breast: Secondary | ICD-10-CM

## 2011-09-26 DIAGNOSIS — R937 Abnormal findings on diagnostic imaging of other parts of musculoskeletal system: Secondary | ICD-10-CM | POA: Insufficient documentation

## 2011-09-26 HISTORY — DX: Malignant (primary) neoplasm, unspecified: C80.1

## 2011-09-26 MED ORDER — TECHNETIUM TC 99M MEDRONATE IV KIT
25.0000 | PACK | Freq: Once | INTRAVENOUS | Status: AC | PRN
  Administered 2011-09-26: 25 via INTRAVENOUS

## 2011-09-27 ENCOUNTER — Ambulatory Visit (HOSPITAL_COMMUNITY)
Admission: RE | Admit: 2011-09-27 | Discharge: 2011-09-27 | Disposition: A | Discharge status: Home / Self Care | Payer: Medicare Other | Source: Ambulatory Visit | Attending: General Surgery | Admitting: General Surgery

## 2011-09-27 ENCOUNTER — Other Ambulatory Visit (HOSPITAL_COMMUNITY): Payer: Self-pay | Admitting: General Surgery

## 2011-09-27 DIAGNOSIS — S2239XA Fracture of one rib, unspecified side, initial encounter for closed fracture: Secondary | ICD-10-CM | POA: Insufficient documentation

## 2011-09-27 DIAGNOSIS — R0781 Pleurodynia: Secondary | ICD-10-CM

## 2011-09-27 DIAGNOSIS — Y929 Unspecified place or not applicable: Secondary | ICD-10-CM | POA: Insufficient documentation

## 2011-09-27 DIAGNOSIS — R079 Chest pain, unspecified: Secondary | ICD-10-CM | POA: Insufficient documentation

## 2011-09-27 DIAGNOSIS — X58XXXA Exposure to other specified factors, initial encounter: Secondary | ICD-10-CM | POA: Insufficient documentation

## 2011-12-08 ENCOUNTER — Other Ambulatory Visit: Payer: Self-pay | Admitting: Dermatology

## 2012-01-06 ENCOUNTER — Other Ambulatory Visit (HOSPITAL_COMMUNITY): Payer: Self-pay | Admitting: Internal Medicine

## 2012-01-06 DIAGNOSIS — IMO0001 Reserved for inherently not codable concepts without codable children: Secondary | ICD-10-CM

## 2012-02-16 ENCOUNTER — Ambulatory Visit (HOSPITAL_COMMUNITY)
Admission: RE | Admit: 2012-02-16 | Discharge: 2012-02-16 | Disposition: A | Discharge status: Home / Self Care | Payer: Medicare Other | Source: Ambulatory Visit | Attending: Internal Medicine | Admitting: Internal Medicine

## 2012-02-16 DIAGNOSIS — Z1231 Encounter for screening mammogram for malignant neoplasm of breast: Secondary | ICD-10-CM | POA: Insufficient documentation

## 2012-02-16 DIAGNOSIS — IMO0001 Reserved for inherently not codable concepts without codable children: Secondary | ICD-10-CM

## 2012-04-30 ENCOUNTER — Other Ambulatory Visit (HOSPITAL_COMMUNITY): Payer: Self-pay | Admitting: Internal Medicine

## 2012-04-30 DIAGNOSIS — Z139 Encounter for screening, unspecified: Secondary | ICD-10-CM

## 2012-05-17 ENCOUNTER — Ambulatory Visit (HOSPITAL_COMMUNITY)
Admission: RE | Admit: 2012-05-17 | Discharge: 2012-05-17 | Disposition: A | Discharge status: Home / Self Care | Payer: Medicare Other | Source: Ambulatory Visit | Attending: Internal Medicine | Admitting: Internal Medicine

## 2012-05-17 DIAGNOSIS — M899 Disorder of bone, unspecified: Secondary | ICD-10-CM | POA: Insufficient documentation

## 2012-05-17 DIAGNOSIS — Z139 Encounter for screening, unspecified: Secondary | ICD-10-CM

## 2012-11-24 ENCOUNTER — Encounter: Payer: Self-pay | Admitting: Orthopedic Surgery

## 2012-11-24 ENCOUNTER — Ambulatory Visit (INDEPENDENT_AMBULATORY_CARE_PROVIDER_SITE_OTHER): Payer: Medicare Other | Admitting: Orthopedic Surgery

## 2012-11-24 VITALS — BP 136/76 | Ht 65.0 in | Wt 150.0 lb

## 2012-11-24 DIAGNOSIS — G5602 Carpal tunnel syndrome, left upper limb: Secondary | ICD-10-CM

## 2012-11-24 DIAGNOSIS — G5600 Carpal tunnel syndrome, unspecified upper limb: Secondary | ICD-10-CM

## 2012-11-24 DIAGNOSIS — M189 Osteoarthritis of first carpometacarpal joint, unspecified: Secondary | ICD-10-CM

## 2012-11-24 DIAGNOSIS — M19049 Primary osteoarthritis, unspecified hand: Secondary | ICD-10-CM

## 2012-11-24 NOTE — Patient Instructions (Addendum)
Carpal Tunnel Syndrome Fact Sheet    You are working at your desk, trying to ignore the tingling or numbness you have had for months in your hand and wrist. Suddenly, a sharp, piercing pain shoots through the wrist and up your arm. Just a passing cramp? More likely you have carpal tunnel syndrome. This is a painful progressive condition caused by compression of a key nerve in the wrist.  WHAT IS CARPAL TUNNEL SYNDROME?  Carpal tunnel syndrome occurs when the nerve that runs from the forearm into the hand (median nerve), becomes pressed or squeezed at the wrist. The median nerve controls sensations to the palm side of the thumb and fingers (although not the little finger). That nerve also controls impulses to some small muscles in the hand, that allow the fingers and thumb to move. The narrow, rigid passageway of ligament and bones at the base of the hand (carpal tunnel) houses the median nerve and tendons. Sometimes, thickening from irritated tendons or other swelling narrows the tunnel. The narrowing causes the median nerve to be compressed. The result may be pain, weakness, or numbness in the hand and wrist that moves (radiates) up the arm. Although painful sensations may indicate other conditions, carpal tunnel syndrome is the most common and widely known of the entrapment neuropathies. These are conditions in which the body's peripheral nerves are compressed or traumatized.  SYMPTOMS   · Symptoms usually start gradually, with:   · Frequent burning.   · Tingling.   · Itching numbness in the palm of the hand and the fingers.   This is especially true in the thumb and the index and middle fingers.  · Some carpal tunnel sufferers say their fingers feel useless and swollen. They may feel this way even though little or no swelling is apparent.   · The symptoms often first appear in one or both hands during the night, since many people sleep with flexed wrists. A person with carpal tunnel syndrome may wake up feeling  the need to shake out the hand or wrist.   · As symptoms worsen, people might feel tingling during the day.   · Decreased grip strength may make it difficult to:   · Form a fist.   · Grasp small objects.   · Perform other manual tasks.   In chronic and/or untreated cases, the muscles at the base of the thumb may waste away.  · Some people are unable to tell between hot and cold by touch.   CAUSES   · Carpal tunnel syndrome is often the result of a combination of factors that increase pressure on the median nerve and tendons in the carpal tunnel. It is usually not a problem with the nerve itself.   · Most likely the disorder is due to a congenital predisposition. This means that the carpal tunnel is simply smaller in some people than in others.   · Other factors include:   · Trauma or injury to the wrist that cause swelling, such as sprain or fracture.   · Overactivity of the pituitary gland.   · Hypothyroidism.   · Rheumatoid arthritis.   · Mechanical problems in the wrist joint.   · Work stress.   · Repeated use of vibrating hand tools.   · Fluid retention during pregnancy or menopause.   · The development of a cyst or tumor in the canal.   · In some cases, no cause can be identified.   · There is little clinical   data to prove whether repetitive and forceful movements of the hand and wrist during work or leisure activities can cause carpal tunnel syndrome. Repeated motions performed in the course of normal work or other daily activities can result in repetitive motion disorders such as bursitis and tendonitis. Writer's cramp, a condition in which a lack of fine motor skill coordination and ache and pressure in the fingers, wrist, or forearm is brought on by repetitive activity, is not a symptom of carpal tunnel syndrome.   WHO IS AT RISK OF DEVELOPING CARPAL TUNNEL SYNDROME?  · Women are three times more likely than men to develop carpal tunnel syndrome. This may be the result of the carpal tunnel being smaller in  women.   · The dominant hand is usually affected first and produces the most severe pain.   · Diabetes or other metabolic disorders that directly affect the body's nerves make people more susceptible to compression.   · Carpal tunnel syndrome usually occurs only in adults.   · The risk of developing carpal tunnel syndrome is not confined to people in a single industry or job. However, it is especially common in those performing assembly line work, such as:   · Manufacturing.   · Sewing.   · Finishing.   · Cleaning.   · Meat packing.   Carpal tunnel syndrome is three times more common among assemblers than among data-entry personnel. A 2001 study by the [facility] found heavy computer use (up to 7 hours a day) did not increase a person's risk of developing carpal tunnel syndrome.  · During 1998, an estimated three of every 10,000 workers lost time from work because of carpal tunnel syndrome. Half of these workers missed more than 10 days of work. The average lifetime cost of carpal tunnel syndrome is estimated to be about $30,000 for each injured worker. This includes medical bills and lost time from work   DIAGNOSIS   · Early diagnosis and treatment are important to avoid permanent damage to the median nerve.   · A physical examination of the hands, arms, shoulders, and neck can help determine if the patient's complaints are related to daily activities or to an underlying disorder. An exam can rule out other painful conditions that mimic carpal tunnel syndrome.   · The wrist is examined for:   · Tenderness.   · Swelling.   · Warmth.   · Discoloration.   · Each finger should be tested for sensation. The muscles at the base of the hand should be examined for strength and signs of shrinking (atrophy).   · Routine laboratory tests and X-rays can reveal:   · Diabetes.   · Arthritis.   · Fractures.   · Physicians can use specific tests to try to produce the symptoms of carpal tunnel syndrome.   · In the Tinel test, the  caregiver taps or presses on the median nerve in the patient's wrist. The test is positive when tingling in the fingers or a shock-like sensation occurs.   · The Phalen or wrist-flexion test involves having the patient hold their forearms upright by pointing the fingers down and pressing the backs of the hands together. The presence of carpal tunnel syndrome is suggested if one or more symptoms is felt in the fingers within 1 minute.   · Caregivers may also ask patients to try to make a movement that brings on symptoms.   · Often it is necessary to confirm the diagnosis by use of   electrodiagnostic tests.   · In a nerve conduction study, electrodes are placed on the hand and wrist. Small electric shocks are applied and the speed with which nerves transmit impulses is measured.   · In electromyography, a fine needle is inserted into a muscle. The electrical activity is viewed on a screen. The activity can determine the severity of damage to the median nerve.   · Ultrasound imaging can show impaired movement of the median nerve.   TREATMENT   Treatments for carpal tunnel syndrome should begin as early as possible. It should be done under a caregiver's direction. Underlying causes such as diabetes or arthritis should be treated first. Initial treatment generally involves:  · Resting the affected hand and wrist for at least 2 weeks.   · Avoiding activities that may worsen symptoms.   · Immobilizing (keeping from moving) the wrist in a splint.   These things are all done to avoid further damage from twisting or bending. If there is inflammation, applying cool packs can help reduce swelling.  NON-SURGICAL  · Drugs - In special circumstances, different drugs can ease the pain and swelling.   · Nonsteroidal anti-inflammatory drugs, such as aspirin, and other non-prescription pain relievers, may ease symptoms.   · Orally administered diuretics (water pills) can decrease swelling.   · Corticosteroids such as prednisone or  lidocaine can be injected directly into the wrist or taken by mouth. This can relieve pressure on the median nerve. It may provide immediate, but temporary relief to persons with mild or intermittent symptoms. (Caution: persons with diabetes and those who may be predisposed to diabetes should note that long term use of corticosteroids can make it difficult to regulate insulin levels. Corticosterioids should not be taken without a prescription.)   · Additionally, some studies show that vitamin B6 (pyridoxine) supplements may ease the symptoms of carpal tunnel syndrome.   · Exercise - Stretching and strengthening exercises can be helpful in people whose symptoms have decreased. These exercises may be supervised by a physical therapist that is trained to use exercises to treat physical impairments. The exercises can also be supervised by an occupational therapist. This is someone who is trained in evaluating people with physical impairments. They will help them build skills to improve their health and well-being.   · Alternative therapies - Acupuncture and chiropractic care have helped some patients. Their effectiveness remains unproved. An exception is yoga, which has been shown to reduce pain and improve grip strength among patients with carpal tunnel syndrome.   SURGERY  Carpal tunnel release is a surgical procedure commonly used. It is generally recommended if symptoms last for 6 months or more. This surgery involves cutting the band of tissue around the wrist to reduce pressure on the median nerve. Surgery is done under local anesthesia. It does not require an overnight hospital stay. Many patients require surgery on both hands. The following are types of carpal tunnel release surgery:  · Open release surgery. This is the traditional procedure used to correct carpal tunnel syndrome. It consists of making a cut (incision) up to 2 inches in the wrist. Then, the surgeon cuts the carpal ligament to enlarge the carpal  tunnel. The procedure is generally done under local anesthesia on an outpatient basis. The only exception is if there are unusual medical considerations.   · Endoscopic surgery may allow faster recovery and less postoperative discomfort than traditional open release surgery. The surgeon makes two incisions (about 1/2" each) in the wrist and   palm. The surgeon will:   · Insert a camera attached to a tube.   · Observe the tissue on a screen.   · Cut the carpal ligament (the tissue that holds joints together).   This two-portal endoscopic surgery, generally performed under local anesthesia, is effective and minimizes scarring and scar tenderness. One-portal endoscopic surgery for carpal tunnel syndrome is also available.  Although symptoms may be relieved immediately after surgery, full recovery from carpal tunnel surgery can take months. Some patients may have:  · Infection.   · Nerve damage.   · Stiffness.   · Pain at the scar.   Occasionally, the wrist loses strength because the carpal ligament is cut. Patients should undergo physical therapy after surgery to restore wrist strength. Some patients may need to adjust job duties or even change jobs after recovery from surgery.  Recurrence of carpal tunnel syndrome following treatment is rare. The majority of patients recover completely.  PREVENTION   At the workplace, workers can:   · Do on-the-job conditioning, perform stretching exercises.   · Take frequent rest breaks.   · Wear splints to keep wrists straight.   · Use correct posture and wrist position.   · Wearing finger-less gloves can help keep hands warm and flexible.   · Design workstations, tools and tool handles, and tasks to enable the worker's wrist to maintain a natural position.   · Rotate jobs among workers.   · Employers can develop programs in ergonomics. This is the process of adapting workplace conditions and job demands to the capabilities of workers. However, research has not conclusively shown that  these workplace changes prevent the occurrence of carpal tunnel syndrome.   WHAT RESEARCH IS BEING DONE?  The National Institute of Neurological Disorders and Stroke (NINDS), a part of the National Institutes of Health, is the federal government's leading supporter of research on carpal tunnel syndrome. Scientists are studying the order of events that occur with carpal tunnel syndrome. They do this to better understand, treat, and prevent this ailment.   WHAT IS PERCUTANEOUS BALLOON CARPAL TUNNEL-PLASTY?  Percutaneous balloon carpal tunnel-plasty is an experimental technique that can ease carpal tunnel pain without cutting the carpal ligament. In this procedure, a 1/4 -inch cut is made at the base of the palm. The caregiver then inserts a balloon through a catheter under the carpal ligament and inflates the balloon to stretch the ligament and free the nerve. Patients in one small study of this procedure reported relief of symptoms with no complications after the surgery. Most of them were back to work within 2 weeks. This experimental technique is not yet widely available.  FOR MORE INFORMATION  American Academy of Orthopaedic Surgeons: www.aaos.org   Centers for Disease Control and Prevention (CDCP): www.cdc.gov  National Institute of Arthritis and Musculoskeletal and Skin Diseases (NIAMS): www.niams.nih.gov  American Chronic Pain Association (ACPA): www.theacpa.org  National Chronic Pain Outreach Association (NCPOA): www.chronicpain.org  Occupational Safety & Health Administration: www.osha.gov  Document Released: 12/11/2003 Document Revised: 01/08/2011 Document Reviewed: 12/15/2007  Exit[employer]® Patient Information ©2012 [employer].

## 2012-11-24 NOTE — Progress Notes (Signed)
Patient ID: Sonya [name], female   DOB: 01-02-1941, 72 y.o.   MRN: [id number] Chief Complaint  Patient presents with  . Hand Pain    Left fingers numb around thumb and at finger tips, Also pain   History  This is a 72 year old female avid golfer no others habits to suggest problems such as carpal tunnel syndrome who presents with numbness and tingling of the left hand and occasionally the right hand which involves the thumb index finger long finger. She did try vitamin B6 a wrist splint Aleve did not improve. She's gone on an outing and would like a carpal tunnel evaluation. Pain came on suddenly associated with sharp dull throbbing burning pain 3/10 comes and goes worse after exercise slightly better with ice  Review of systems chills fatigue heart burn irritable bowel syndrome history of diarrhea itching of the skin redness numbness tingling as described seasonal allergies joint pain and stiffness  The past, family history and social history have been reviewed and are recorded in the corresponding sections of epic   BP 136/76  Ht 5\' 5"  (1.651 m)  Wt 150 lb (68.04 kg)  BMI 24.96 kg/m2 The patient is well-developed and well-nourished grooming and hygiene are normal. She has normal pulse and perfusion in the left upper extremity. She is ambulatory no assistive devices normal ambulation pattern. Her left wrist is tender over the carpal tunnel and at the base of the thumb is decreased sensation in the median nerve distribution with lack of involvement of the small finger. Wrist joint stability confirmed by Claudette Laws test which was normal she has good range of motion normal strength scans intact she is awake alert and oriented x3 mood and affect are normal  She is going away this weekend she'll be back Sunday she wants to know what else can be done. 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 After a sterile alcohol prep, the skin and subcutaneous layer overlying the carpal tunnel were anesthetized with Ethyl Chloride.  A needle was advanced into the carpal tunnel space, carefully avoiding the nerve.  Free flow into the carpal tunnel space was confirmed.  The tunnel was then injected with 1 ml of depomedrol. The area was cleansed with isopropyl alcohol and a dressing was applied.  Complications:  None; patient tolerated the procedure well.

## 2012-11-30 ENCOUNTER — Ambulatory Visit (INDEPENDENT_AMBULATORY_CARE_PROVIDER_SITE_OTHER): Payer: Medicare Other | Admitting: Orthopedic Surgery

## 2012-11-30 ENCOUNTER — Encounter: Payer: Self-pay | Admitting: Orthopedic Surgery

## 2012-11-30 ENCOUNTER — Ambulatory Visit: Payer: Medicare Other | Admitting: Orthopedic Surgery

## 2012-11-30 VITALS — BP 122/74 | Ht 65.0 in | Wt 150.0 lb

## 2012-11-30 DIAGNOSIS — G5602 Carpal tunnel syndrome, left upper limb: Secondary | ICD-10-CM

## 2012-11-30 DIAGNOSIS — G5600 Carpal tunnel syndrome, unspecified upper limb: Secondary | ICD-10-CM

## 2012-11-30 NOTE — Progress Notes (Signed)
Patient ID: Sonya [name], female   DOB: May 04, 1941, 72 y.o.   MRN: [id number] Chief Complaint  Patient presents with  . Follow-up    6 DAY RECHECK LEFT CTS    BP 122/74  Ht 5\' 5"  (1.651 m)  Wt 150 lb (68.04 kg)  BMI 24.96 kg/m2  Status post injection left carpal tunnel patient reports improvement in her symptoms just has some numbness just at the fingertips no night pain at this time  No real weakness she was able to hit golf ball without any difficulty  BP 122/74  Ht 5\' 5"  (1.651 m)  Wt 150 lb (68.04 kg)  BMI 24.96 kg/m2 General appearance is normal, the patient is alert and oriented x3 with normal mood and affect. Appears to be make a fist normally. No weakness. Color is good capillary refill is good  Impression carpal tunnel syndrome improved  Followup as needed may need repeat injection in a month or so, we'll try that prior to any surgical intervention unless symptoms really get bad

## 2012-11-30 NOTE — Patient Instructions (Signed)
activities as tolerated 

## 2013-01-26 ENCOUNTER — Other Ambulatory Visit (HOSPITAL_COMMUNITY): Payer: Self-pay | Admitting: Internal Medicine

## 2013-01-26 DIAGNOSIS — Z139 Encounter for screening, unspecified: Secondary | ICD-10-CM

## 2013-01-26 DIAGNOSIS — Z09 Encounter for follow-up examination after completed treatment for conditions other than malignant neoplasm: Secondary | ICD-10-CM

## 2013-02-17 ENCOUNTER — Ambulatory Visit (HOSPITAL_COMMUNITY)
Admission: RE | Admit: 2013-02-17 | Discharge: 2013-02-17 | Disposition: A | Discharge status: Home / Self Care | Payer: Medicare Other | Source: Ambulatory Visit | Attending: Internal Medicine | Admitting: Internal Medicine

## 2013-02-17 DIAGNOSIS — Z139 Encounter for screening, unspecified: Secondary | ICD-10-CM

## 2013-02-17 DIAGNOSIS — Z1231 Encounter for screening mammogram for malignant neoplasm of breast: Secondary | ICD-10-CM | POA: Insufficient documentation

## 2013-02-24 ENCOUNTER — Encounter: Payer: Self-pay | Admitting: Orthopedic Surgery

## 2013-02-24 ENCOUNTER — Ambulatory Visit (INDEPENDENT_AMBULATORY_CARE_PROVIDER_SITE_OTHER): Payer: Medicare Other | Admitting: Orthopedic Surgery

## 2013-02-24 VITALS — BP 133/78 | Ht 65.0 in | Wt 150.0 lb

## 2013-02-24 DIAGNOSIS — G5601 Carpal tunnel syndrome, right upper limb: Secondary | ICD-10-CM

## 2013-02-24 DIAGNOSIS — M189 Osteoarthritis of first carpometacarpal joint, unspecified: Secondary | ICD-10-CM

## 2013-02-24 DIAGNOSIS — G5600 Carpal tunnel syndrome, unspecified upper limb: Secondary | ICD-10-CM | POA: Insufficient documentation

## 2013-02-24 DIAGNOSIS — M19049 Primary osteoarthritis, unspecified hand: Secondary | ICD-10-CM

## 2013-02-24 MED ORDER — GABAPENTIN 100 MG PO CAPS: 100.0000 mg | ORAL_CAPSULE | Freq: Every day | ORAL | Status: DC

## 2013-02-24 NOTE — Patient Instructions (Addendum)
Pick up prescription at pharmacy Refer to Dr. Gerilyn Pilgrim for carpal tunnel test bilateral (patient will be out of town next week, leaving Saturday 02/26/13)

## 2013-02-24 NOTE — Progress Notes (Signed)
Patient ID: Sonya [name], female   DOB: 1941-01-04, 72 y.o.   MRN: [id number]  Chief Complaint  Patient presents with  . Hand Pain    Request injection for carpal tunnel syndrome of the right hand   Patient complains of pain actually in her right thumb with numbness at the base of the thumb pain in the flexor retinaculum area of the wrist radiating up into her forearm towards her elbow. Denies trauma. She does complain of waking up at night and having to shake the hand to get back to sleep. Her symptoms seem a little atypical of carpal tunnel syndrome which she has on the left side and has done well with injection.  Review of systems she denies any neck pain or pain above the elbow  BP 133/78  Ht 5\' 5"  (1.651 m)  Wt 150 lb (68.04 kg)  BMI 24.96 kg/m2 General appearance is normal, the patient is alert and oriented x3 with normal mood and affect. We do not find numbness and tingling or decreased sensation in the right hand we find tenderness at the base of the thumb negative grind test mild subluxation of the basilar joint we find tenderness in the palm over the carpal tunnel the flexor retinaculum and forearm up to the elbow. We find negative Spurling is reasonable range of motion in the neck without neck pain or tenderness  Impression possible carpal tunnel syndrome versus basal joint arthritis versus tenosynovitis of the flexor tendons  Recommend nerve conduction study thumb spica splint for osteoarthritis of the base of the thumb and a followup appointment on the 28th. We will schedule the nerve conduction study and get those results back to her by phone  We will start Neurontin 100 mg at night

## 2013-02-25 ENCOUNTER — Other Ambulatory Visit: Payer: Self-pay | Admitting: *Deleted

## 2013-02-25 ENCOUNTER — Telehealth: Payer: Self-pay | Admitting: *Deleted

## 2013-02-25 DIAGNOSIS — G5600 Carpal tunnel syndrome, unspecified upper limb: Secondary | ICD-10-CM

## 2013-02-25 NOTE — Telephone Encounter (Signed)
Notes and referral faxed to Dr. Gerilyn Pilgrim. Awaiting appointment.

## 2013-03-01 ENCOUNTER — Ambulatory Visit: Payer: Medicare Other | Admitting: Orthopedic Surgery

## 2013-03-07 NOTE — Telephone Encounter (Signed)
Patient has an appointment with Dr. Gerilyn Pilgrim March 21, 2013 at 11:00 am. Patient is aware of appointment.

## 2013-03-10 ENCOUNTER — Ambulatory Visit: Payer: Medicare Other | Admitting: Orthopedic Surgery

## 2013-03-29 ENCOUNTER — Other Ambulatory Visit: Payer: Self-pay | Admitting: *Deleted

## 2013-03-29 ENCOUNTER — Ambulatory Visit (INDEPENDENT_AMBULATORY_CARE_PROVIDER_SITE_OTHER): Payer: Medicare Other | Admitting: Orthopedic Surgery

## 2013-03-29 ENCOUNTER — Encounter: Payer: Self-pay | Admitting: Orthopedic Surgery

## 2013-03-29 VITALS — BP 124/70 | Ht 65.0 in | Wt 150.0 lb

## 2013-03-29 DIAGNOSIS — G5601 Carpal tunnel syndrome, right upper limb: Secondary | ICD-10-CM

## 2013-03-29 DIAGNOSIS — G5603 Carpal tunnel syndrome, bilateral upper limbs: Secondary | ICD-10-CM | POA: Insufficient documentation

## 2013-03-29 DIAGNOSIS — G5600 Carpal tunnel syndrome, unspecified upper limb: Secondary | ICD-10-CM

## 2013-03-29 NOTE — Patient Instructions (Signed)
Carpal Tunnel Release Surgery in January

## 2013-03-29 NOTE — Progress Notes (Signed)
Patient ID: Sonya [name], female   DOB: 1941-03-29, 72 y.o.   MRN: [id number]  Chief Complaint  Patient presents with  . Follow-up    NCS results and recheck of right CTS.   HISTORY: The patient presents for reevaluation of her right and left wrist status post nerve conduction study to confirm carpal tunnel syndrome of the right upper extremity she also has bilateral disease with severe findings on nerve conduction study  She complains of severe left pain swelling and numbness with tingling radiating proximally and also complains of right sided symptoms  Denies any proximal symptoms to distal.  Inject right carpal tunnel  Inject left carpal tunnel tomorrow Surgery left carpal tunnel in January left left  Carpal Tunnel Injection Procedure Note  Pre-operative Diagnosis: leftt carpal tunnel syndrome  Post-operative Diagnosis: same  Indications: failure of conservative therapy  Procedure Details  Verbal consent for the procedure was obtained. After a sterile alcohol prep, the skin and subcutaneous layer overlying the left carpal tunnel were anesthetized with Ethyl Chloride.  A needle was advanced into the carpal tunnel space, carefully avoiding the nerve.  Free flow into the carpal tunnel space was confirmed.  The tunnel was then injected with 1 ml of depomedrol. The area was cleansed with isopropyl alcohol and a dressing was applied.  Complications:  None; patient tolerated the procedure well.

## 2013-03-30 ENCOUNTER — Ambulatory Visit (INDEPENDENT_AMBULATORY_CARE_PROVIDER_SITE_OTHER): Payer: Medicare Other | Admitting: Orthopedic Surgery

## 2013-03-30 DIAGNOSIS — G5600 Carpal tunnel syndrome, unspecified upper limb: Secondary | ICD-10-CM

## 2013-03-30 DIAGNOSIS — G5602 Carpal tunnel syndrome, left upper limb: Secondary | ICD-10-CM

## 2013-03-30 NOTE — Progress Notes (Signed)
Patient ID: Sonya [name], female   DOB: August 17, 1940, 72 y.o.   MRN: [id number]  Chief Complaint  Patient presents with  . Follow-up    Left CT injection today.    Carpal Tunnel Injection Procedure Note  Pre-operative Diagnosis: left carpal tunnel syndrome  Post-operative Diagnosis: same  Indications: failure of conservative therapy  Procedure Details  Verbal consent for the procedure was obtained. After a sterile alcohol prep, the skin and subcutaneous layer overlying the left carpal tunnel were anesthetized with Ethyl Chloride.  A needle was advanced into the carpal tunnel space, carefully avoiding the nerve.  Free flow into the carpal tunnel space was confirmed.  The tunnel was then injected with 1 ml of depomedrol. The area was cleansed with isopropyl alcohol and a dressing was applied.  Complications:  None; patient tolerated the procedure well.

## 2013-04-01 ENCOUNTER — Ambulatory Visit (HOSPITAL_COMMUNITY)
Admission: RE | Admit: 2013-04-01 | Discharge: 2013-04-01 | Disposition: A | Discharge status: Home / Self Care | Payer: Medicare Other | Source: Ambulatory Visit | Attending: General Surgery | Admitting: General Surgery

## 2013-04-01 ENCOUNTER — Other Ambulatory Visit (HOSPITAL_COMMUNITY): Payer: Self-pay | Admitting: General Surgery

## 2013-04-01 DIAGNOSIS — R059 Cough, unspecified: Secondary | ICD-10-CM

## 2013-04-01 DIAGNOSIS — R05 Cough: Secondary | ICD-10-CM

## 2013-04-01 DIAGNOSIS — Z853 Personal history of malignant neoplasm of breast: Secondary | ICD-10-CM | POA: Insufficient documentation

## 2013-04-01 DIAGNOSIS — J438 Other emphysema: Secondary | ICD-10-CM | POA: Insufficient documentation

## 2013-05-09 ENCOUNTER — Telehealth: Payer: Self-pay | Admitting: Orthopedic Surgery

## 2013-05-09 NOTE — Telephone Encounter (Signed)
Regarding out-patient surgery scheduled 05/20/12 at [facility], CPT [phone], contacted insurer, [employer], ph# [phone]; per Clayborne Artist, no pre-authorization required for in-network providers for this code, his name and today's date for reference, 05/09/13, 11:01a.m.

## 2013-05-11 NOTE — Patient Instructions (Signed)
Sonya [name]  05/11/2013   Your procedure is scheduled on:  05/20/13  Report to [facility] at 06:15 AM.  Call this number if you have problems the morning of surgery: [phone]   Remember:   Do not eat food or drink liquids after midnight.   Take these medicines the morning of surgery with A SIP OF WATER: None   Do not wear jewelry, make-up or nail polish.  Do not wear lotions, powders, or perfumes.   Do not shave 48 hours prior to surgery. Men may shave face and neck.  Do not bring valuables to the hospital.  [facility] is not responsible for any belongings or valuables.               Contacts, dentures or bridgework may not be worn into surgery.  Leave suitcase in the car. After surgery it may be brought to your room.  For patients admitted to the hospital, discharge time is determined by your treatment team.               Patients discharged the day of surgery will not be allowed to drive home.   Special Instructions: Shower using CHG 2 nights before surgery and the night before surgery.  If you shower the day of surgery use CHG.  Use special wash - you have one bottle of CHG for all showers.  You should use approximately 1/3 of the bottle for each shower.   Please read over the following fact sheets that you were given: Pain Booklet, Surgical Site Infection Prevention, Anesthesia Post-op Instructions and Care and Recovery After Surgery    Carpal Tunnel Release Carpal tunnel release is done to relieve the pressure on the nerves and tendons on the bottom side of your wrist.  LET YOUR CAREGIVER KNOW ABOUT:   Allergies to food or medicine.  Medicines taken, including vitamins, herbs, eyedrops, over-the-counter medicines, and creams.  Use of steroids (by mouth or creams).  Previous problems with anesthetics or numbing medicines.  History of bleeding problems or blood clots.  Previous surgery.  Other health problems, including diabetes and kidney  problems.  Possibility of pregnancy, if this applies. RISKS AND COMPLICATIONS  Some problems that may happen after this procedure include:  Infection.  Damage to the nerves, arteries or tendons could occur. This would be very uncommon.  Bleeding. BEFORE THE PROCEDURE   This surgery may be done while you are asleep (general anesthetic) or may be done under a block where only your forearm and the surgical area is numb.  If the surgery is done under a block, the numbness will gradually wear off within several hours after surgery. HOME CARE INSTRUCTIONS   Have a responsible person with you for 24 hours.  Do not drive a car or use public transportation for 24 hours.  Only take over-the-counter or prescription medicines for pain, discomfort, or fever as directed by your caregiver. Take them as directed.  You may put ice on the palm side of the affected wrist.  Put ice in a plastic bag.  Place a towel between your skin and the bag.  Leave the ice on for 20 to 30 minutes, 4 times per day.  If you were given a splint to keep your wrist from bending, use it as directed. It is important to wear the splint at night or as directed. Use the splint for as long as you have pain or numbness in your hand, arm, or wrist. This may take  1 to 2 months.  Keep your hand raised (elevated) above the level of your heart as much as possible. This keeps swelling down and helps with discomfort.  Change bandages (dressings) as directed.  Keep the wound clean and dry. SEEK MEDICAL CARE IF:   You develop pain not relieved with medications.  You develop numbness of your hand.  You develop bleeding from your surgical site.  You have an oral temperature above 102 F (38.9 C).  You develop redness or swelling of the surgical site.  You develop new, unexplained problems. SEEK IMMEDIATE MEDICAL CARE IF:   You develop a rash.  You have difficulty breathing.  You develop any reaction or side  effects to medications given. Document Released: 07/19/2003 Document Revised: 07/21/2011 Document Reviewed: 03/04/2007 [facility] Patient Information 2014 [place], [place].    PATIENT INSTRUCTIONS POST-ANESTHESIA  IMMEDIATELY FOLLOWING SURGERY:  Do not drive or operate machinery for the first twenty four hours after surgery.  Do not make any important decisions for twenty four hours after surgery or while taking narcotic pain medications or sedatives.  If you develop intractable nausea and vomiting or a severe headache please notify your doctor immediately.  FOLLOW-UP:  Please make an appointment with your surgeon as instructed. You do not need to follow up with anesthesia unless specifically instructed to do so.  WOUND CARE INSTRUCTIONS (if applicable):  Keep a dry clean dressing on the anesthesia/puncture wound site if there is drainage.  Once the wound has quit draining you may leave it open to air.  Generally you should leave the bandage intact for twenty four hours unless there is drainage.  If the epidural site drains for more than 36-48 hours please call the anesthesia department.  QUESTIONS?:  Please feel free to call your physician or the hospital operator if you have any questions, and they will be happy to assist you.

## 2013-05-12 HISTORY — PX: CARPAL TUNNEL RELEASE: SHX101

## 2013-05-13 ENCOUNTER — Encounter (HOSPITAL_COMMUNITY)
Admission: RE | Admit: 2013-05-13 | Discharge: 2013-05-13 | Disposition: A | Discharge status: Home / Self Care | Payer: Medicare Other | Source: Ambulatory Visit | Attending: Orthopedic Surgery | Admitting: Orthopedic Surgery

## 2013-05-13 ENCOUNTER — Encounter (HOSPITAL_COMMUNITY): Payer: Self-pay

## 2013-05-13 ENCOUNTER — Encounter (HOSPITAL_COMMUNITY): Payer: Self-pay | Admitting: Pharmacy Technician

## 2013-05-13 DIAGNOSIS — Z01812 Encounter for preprocedural laboratory examination: Secondary | ICD-10-CM | POA: Insufficient documentation

## 2013-05-13 DIAGNOSIS — Z01818 Encounter for other preprocedural examination: Secondary | ICD-10-CM | POA: Insufficient documentation

## 2013-05-13 LAB — COMPREHENSIVE METABOLIC PANEL
ALK PHOS: 69 U/L (ref 39–117)
ALT: 14 U/L (ref 0–35)
AST: 22 U/L (ref 0–37)
Albumin: 4.5 g/dL (ref 3.5–5.2)
BILIRUBIN TOTAL: 0.7 mg/dL (ref 0.3–1.2)
BUN: 24 mg/dL — AB (ref 6–23)
CO2: 24 meq/L (ref 19–32)
Calcium: 9.8 mg/dL (ref 8.4–10.5)
Chloride: 103 mEq/L (ref 96–112)
Creatinine, Ser: 0.95 mg/dL (ref 0.50–1.10)
GFR, EST AFRICAN AMERICAN: 68 mL/min — AB (ref >90)
GFR, EST NON AFRICAN AMERICAN: 58 mL/min — AB (ref >90)
GLUCOSE: 102 mg/dL — AB (ref 70–99)
POTASSIUM: 4 meq/L (ref 3.7–5.3)
Sodium: 141 mEq/L (ref 137–147)
TOTAL PROTEIN: 7.3 g/dL (ref 6.0–8.3)

## 2013-05-13 LAB — LIPID PANEL
CHOL/HDL RATIO: 2.5 ratio
CHOLESTEROL: 255 mg/dL — AB (ref 0–200)
HDL: 103 mg/dL (ref >39)
LDL Cholesterol: 132 mg/dL — ABNORMAL HIGH (ref 0–99)
Triglycerides: 98 mg/dL (ref <150)
VLDL: 20 mg/dL (ref 0–40)

## 2013-05-13 LAB — CBC WITH DIFFERENTIAL/PLATELET
BASOS ABS: 0 10*3/uL (ref 0.0–0.1)
BASOS PCT: 1 % (ref 0–1)
EOS ABS: 0.1 10*3/uL (ref 0.0–0.7)
Eosinophils Relative: 2 % (ref 0–5)
HCT: 41.2 % (ref 36.0–46.0)
Hemoglobin: 14.1 g/dL (ref 12.0–15.0)
Lymphocytes Relative: 26 % (ref 12–46)
Lymphs Abs: 1.7 10*3/uL (ref 0.7–4.0)
MCH: 33.4 pg (ref 26.0–34.0)
MCHC: 34.2 g/dL (ref 30.0–36.0)
MCV: 97.6 fL (ref 78.0–100.0)
MONOS PCT: 6 % (ref 3–12)
Monocytes Absolute: 0.4 10*3/uL (ref 0.1–1.0)
NEUTROS ABS: 4.3 10*3/uL (ref 1.7–7.7)
NEUTROS PCT: 65 % (ref 43–77)
Platelets: 245 10*3/uL (ref 150–400)
RBC: 4.22 MIL/uL (ref 3.87–5.11)
RDW: 13.5 % (ref 11.5–15.5)
WBC: 6.6 10*3/uL (ref 4.0–10.5)

## 2013-05-13 LAB — URINALYSIS, ROUTINE W REFLEX MICROSCOPIC
BILIRUBIN URINE: NEGATIVE
Glucose, UA: NEGATIVE mg/dL
Hgb urine dipstick: NEGATIVE
KETONES UR: NEGATIVE mg/dL
LEUKOCYTES UA: NEGATIVE
NITRITE: NEGATIVE
PH: 6 (ref 5.0–8.0)
PROTEIN: NEGATIVE mg/dL
Specific Gravity, Urine: 1.02 (ref 1.005–1.030)
UROBILINOGEN UA: 0.2 mg/dL (ref 0.0–1.0)

## 2013-05-16 ENCOUNTER — Telehealth: Payer: Self-pay | Admitting: *Deleted

## 2013-05-16 NOTE — Telephone Encounter (Signed)
FYI... Sonya [name], with Short Stay at [facility], called 05/13/13 to inform Dr. Aline Brochure as a FYI that patient came in for pre-op bloodwork and a BMET was done instead of a CMET. Dr. Willey Blade ordered it.

## 2013-05-19 NOTE — H&P (Signed)
Sonya [name] is an 73 y.o. female.   Chief Complaint: Pain and paresthesias left wrist and hand HPI: 73 year-old female with no history of trauma has been treated with injection and anti-inflammatories rest and activity modification as well as bracing for pain and paresthesias in the median nerve distribution. Her symptoms have overwhelmed nonoperative treatment and she would like to proceed with carpal tunnel release of the left wrist.    Past Medical History  Diagnosis Date  . Cancer     Left Breast    Past Surgical History  Procedure Laterality Date  . Tonsillectomy    . Abdominal hysterectomy    . Breast surgery      No family history on file. Social History:  reports that she quit smoking about 26 years ago. Her smoking use included Cigarettes. She has a 5 pack-year smoking history. She does not have any smokeless tobacco history on file. She reports that she drinks about 1.2 ounces of alcohol per week. She reports that she does not use illicit drugs.  Allergies:  Allergies  Allergen Reactions  . Celecoxib     REACTION: rash, sob, edema, reddness    No prescriptions prior to admission    No results found for this or any previous visit (from the past 48 hour(s)). No results found.  ROS no major review of systems findings at this time she does have some occasional bowel discomfort from irritable bowel disease she has some symptoms in the right upper extremity of carpal tunnel and tendinitis as well.  There were no vitals taken for this visit. Physical Exam  She is well-developed and well-nourished grooming and hygiene are normal body habitus is normal. Peripheral vascular system no swelling or varicose veins of any significance left upper extremity pulses are intact temperature of the hand is normal there is no edema and there is no vascular related tenderness. Epitrochlear lymph nodes are normal.  She has normal gait and station.  On inspection of the left hand  there is tenderness over the carpal tunnel normal range of motion wrist joint stability confirmed muscle strength and tone normal skin intact coordination normal reflexes normal she has decreased sensation in the index and long finger a portion of the ring finger and thumb most of the numbness and tingling is from the wrist distally. Mental status she is oriented to time person and place mood and affect are normal Assessment/Plan Left carpal tunnel syndrome failed nonoperative treatment  Plan open left carpal tunnel release  Arther Abbott 05/19/2013, 8:48 AM

## 2013-05-20 ENCOUNTER — Encounter (HOSPITAL_COMMUNITY): Payer: Self-pay | Admitting: *Deleted

## 2013-05-20 ENCOUNTER — Encounter (HOSPITAL_COMMUNITY)
Admission: RE | Disposition: A | Discharge status: Home / Self Care | Payer: Self-pay | Source: Ambulatory Visit | Attending: Orthopedic Surgery

## 2013-05-20 ENCOUNTER — Ambulatory Visit (HOSPITAL_COMMUNITY): Payer: Medicare Other | Admitting: Anesthesiology

## 2013-05-20 ENCOUNTER — Ambulatory Visit (HOSPITAL_COMMUNITY)
Admission: RE | Admit: 2013-05-20 | Discharge: 2013-05-20 | Disposition: A | Discharge status: Home / Self Care | Payer: Medicare Other | Source: Ambulatory Visit | Attending: Orthopedic Surgery | Admitting: Orthopedic Surgery

## 2013-05-20 ENCOUNTER — Encounter (HOSPITAL_COMMUNITY): Payer: Medicare Other | Admitting: Anesthesiology

## 2013-05-20 DIAGNOSIS — G5600 Carpal tunnel syndrome, unspecified upper limb: Secondary | ICD-10-CM

## 2013-05-20 DIAGNOSIS — G5602 Carpal tunnel syndrome, left upper limb: Secondary | ICD-10-CM

## 2013-05-20 DIAGNOSIS — M6580 Other synovitis and tenosynovitis, unspecified site: Secondary | ICD-10-CM | POA: Insufficient documentation

## 2013-05-20 HISTORY — PX: CARPAL TUNNEL RELEASE: SHX101

## 2013-05-20 SURGERY — CARPAL TUNNEL RELEASE
Anesthesia: Monitor Anesthesia Care | Laterality: Left

## 2013-05-20 MED ORDER — FENTANYL CITRATE 0.05 MG/ML IJ SOLN
INTRAMUSCULAR | Status: DC | PRN
  Administered 2013-05-20 (×3): 25 ug via INTRAVENOUS

## 2013-05-20 MED ORDER — PROPOFOL INFUSION 10 MG/ML OPTIME
INTRAVENOUS | Status: DC | PRN
  Administered 2013-05-20: 125 ug/kg/min via INTRAVENOUS

## 2013-05-20 MED ORDER — MIDAZOLAM HCL 2 MG/2ML IJ SOLN
1.0000 mg | INTRAMUSCULAR | Status: DC | PRN
  Administered 2013-05-20: 2 mg via INTRAVENOUS

## 2013-05-20 MED ORDER — LACTATED RINGERS IV SOLN: INTRAVENOUS | Status: DC

## 2013-05-20 MED ORDER — ONDANSETRON HCL 4 MG/2ML IJ SOLN
4.0000 mg | Freq: Once | INTRAMUSCULAR | Status: AC
  Administered 2013-05-20: 4 mg via INTRAVENOUS
  Filled 2013-05-20: qty 2

## 2013-05-20 MED ORDER — LIDOCAINE HCL (PF) 0.5 % IJ SOLN
INTRAMUSCULAR | Status: AC
  Filled 2013-05-20: qty 50

## 2013-05-20 MED ORDER — BUPIVACAINE HCL (PF) 0.5 % IJ SOLN
INTRAMUSCULAR | Status: DC | PRN
  Administered 2013-05-20: 10 mL

## 2013-05-20 MED ORDER — LACTATED RINGERS IV SOLN
INTRAVENOUS | Status: DC
  Administered 2013-05-20: 07:00:00 via INTRAVENOUS

## 2013-05-20 MED ORDER — FENTANYL CITRATE 0.05 MG/ML IJ SOLN
INTRAMUSCULAR | Status: AC
  Filled 2013-05-20: qty 2

## 2013-05-20 MED ORDER — SODIUM CHLORIDE 0.9 % IR SOLN
Status: DC | PRN
  Administered 2013-05-20: 1000 mL

## 2013-05-20 MED ORDER — HYDROCODONE-ACETAMINOPHEN 5-325 MG PO TABS
1.0000 | ORAL_TABLET | Freq: Once | ORAL | Status: AC
  Administered 2013-05-20: 1 via ORAL
  Filled 2013-05-20: qty 1

## 2013-05-20 MED ORDER — FENTANYL CITRATE 0.05 MG/ML IJ SOLN
25.0000 ug | INTRAMUSCULAR | Status: AC
  Administered 2013-05-20: 25 ug via INTRAVENOUS

## 2013-05-20 MED ORDER — MIDAZOLAM HCL 2 MG/2ML IJ SOLN
INTRAMUSCULAR | Status: AC
  Filled 2013-05-20: qty 2

## 2013-05-20 MED ORDER — ONDANSETRON HCL 4 MG/2ML IJ SOLN: 4.0000 mg | Freq: Once | INTRAMUSCULAR | Status: DC | PRN

## 2013-05-20 MED ORDER — CEFAZOLIN SODIUM-DEXTROSE 2-3 GM-% IV SOLR
2.0000 g | INTRAVENOUS | Status: AC
  Administered 2013-05-20: 2 g via INTRAVENOUS
  Filled 2013-05-20: qty 50

## 2013-05-20 MED ORDER — LIDOCAINE HCL (PF) 1 % IJ SOLN
INTRAMUSCULAR | Status: AC
  Filled 2013-05-20: qty 5

## 2013-05-20 MED ORDER — PROPOFOL 10 MG/ML IV EMUL
INTRAVENOUS | Status: AC
  Filled 2013-05-20: qty 20

## 2013-05-20 MED ORDER — HYDROCODONE-ACETAMINOPHEN 5-325 MG PO TABS: 1.0000 | ORAL_TABLET | Freq: Four times a day (QID) | ORAL | Status: DC | PRN

## 2013-05-20 MED ORDER — FENTANYL CITRATE 0.05 MG/ML IJ SOLN: 25.0000 ug | INTRAMUSCULAR | Status: DC | PRN

## 2013-05-20 MED ORDER — BUPIVACAINE HCL (PF) 0.5 % IJ SOLN
INTRAMUSCULAR | Status: AC
  Filled 2013-05-20: qty 30

## 2013-05-20 SURGICAL SUPPLY — 40 items
BAG HAMPER (MISCELLANEOUS) ×3 IMPLANT
BANDAGE ELASTIC 3 VELCRO NS (GAUZE/BANDAGES/DRESSINGS) ×3 IMPLANT
BANDAGE ESMARK 4X12 BL STRL LF (DISPOSABLE) ×1 IMPLANT
BLADE SURG 15 STRL LF DISP TIS (BLADE) ×1 IMPLANT
BLADE SURG 15 STRL SS (BLADE) ×3
BNDG CMPR 12X4 ELC STRL LF (DISPOSABLE) ×1
BNDG COHESIVE 4X5 TAN NS LF (GAUZE/BANDAGES/DRESSINGS) ×3 IMPLANT
BNDG ESMARK 4X12 BLUE STRL LF (DISPOSABLE) ×3
BNDG GAUZE ELAST 4 BULKY (GAUZE/BANDAGES/DRESSINGS) ×3 IMPLANT
CHLORAPREP W/TINT 26ML (MISCELLANEOUS) ×3 IMPLANT
CLOTH BEACON ORANGE TIMEOUT ST (SAFETY) ×3 IMPLANT
COVER LIGHT HANDLE STERIS (MISCELLANEOUS) ×12 IMPLANT
CUFF TOURNIQUET SINGLE 18IN (TOURNIQUET CUFF) ×3 IMPLANT
DECANTER SPIKE VIAL GLASS SM (MISCELLANEOUS) ×3 IMPLANT
DRSG XEROFORM 1X8 (GAUZE/BANDAGES/DRESSINGS) ×2 IMPLANT
ELECT NEEDLE TIP 2.8 STRL (NEEDLE) ×3 IMPLANT
ELECT REM PT RETURN 9FT ADLT (ELECTROSURGICAL) ×3
ELECTRODE REM PT RTRN 9FT ADLT (ELECTROSURGICAL) ×1 IMPLANT
GLOVE BIOGEL PI IND STRL 7.0 (GLOVE) IMPLANT
GLOVE BIOGEL PI IND STRL 7.5 (GLOVE) IMPLANT
GLOVE BIOGEL PI INDICATOR 7.0 (GLOVE) ×2
GLOVE BIOGEL PI INDICATOR 7.5 (GLOVE) ×2
GLOVE SKINSENSE NS SZ8.0 LF (GLOVE) ×2
GLOVE SKINSENSE STRL SZ8.0 LF (GLOVE) ×1 IMPLANT
GLOVE SS BIOGEL STRL SZ 6.5 (GLOVE) IMPLANT
GLOVE SS N UNI LF 8.5 STRL (GLOVE) ×3 IMPLANT
GLOVE SUPERSENSE BIOGEL SZ 6.5 (GLOVE) ×2
GOWN STRL REIN XL XLG (GOWN DISPOSABLE) ×9 IMPLANT
HAND ALUMI XLG (SOFTGOODS) ×3 IMPLANT
KIT ROOM TURNOVER APOR (KITS) ×3 IMPLANT
MANIFOLD NEPTUNE II (INSTRUMENTS) ×3 IMPLANT
NDL HYPO 21X1.5 SAFETY (NEEDLE) ×1 IMPLANT
NEEDLE HYPO 21X1.5 SAFETY (NEEDLE) ×3 IMPLANT
NS IRRIG 1000ML POUR BTL (IV SOLUTION) ×3 IMPLANT
PACK BASIC LIMB (CUSTOM PROCEDURE TRAY) ×3 IMPLANT
PAD ARMBOARD 7.5X6 YLW CONV (MISCELLANEOUS) ×3 IMPLANT
SET BASIN LINEN APH (SET/KITS/TRAYS/PACK) ×3 IMPLANT
SPONGE GAUZE 4X4 12PLY (GAUZE/BANDAGES/DRESSINGS) ×2 IMPLANT
SUT ETHILON 3 0 FSL (SUTURE) ×3 IMPLANT
SYR CONTROL 10ML LL (SYRINGE) ×3 IMPLANT

## 2013-05-20 NOTE — Progress Notes (Signed)
Patient alert and talking. No complaints of pain.

## 2013-05-20 NOTE — Transfer of Care (Signed)
Immediate Anesthesia Transfer of Care Note  Patient: Sonya [name]  Procedure(s) Performed: Procedure(s) (LRB): CARPAL TUNNEL RELEASE (Left)  Patient Location: PACU  Anesthesia Type: MAC  Level of Consciousness: awake  Airway & Oxygen Therapy: Patient Spontanous Breathing.   Post-op Assessment: Report given to PACU RN, Post -op Vital signs reviewed and stable and Patient moving all extremities  Post vital signs: Reviewed and stable  Complications: No apparent anesthesia complications

## 2013-05-20 NOTE — Interval H&P Note (Signed)
History and Physical Interval Note:  05/20/2013 7:22 AM  Sonya [name]  has presented today for surgery, with the diagnosis of Left carpal tunnel syndrome  The various methods of treatment have been discussed with the patient and family. After consideration of risks, benefits and other options for treatment, the patient has consented to  Procedure(s): CARPAL TUNNEL RELEASE (Left) as a surgical intervention .  The patient's history has been reviewed, patient examined, no change in status, stable for surgery.  I have reviewed the patient's chart and labs.  Questions were answered to the patient's satisfaction.     Jester Klingberg   

## 2013-05-20 NOTE — Discharge Instructions (Signed)
Incision Care An incision is when a surgeon cuts into your body tissues. After surgery, the incision needs to be cared for properly to prevent infection.  HOME CARE INSTRUCTIONS   Take all medicine as directed by your caregiver. Only take over-the-counter or prescription medicines for pain, discomfort, or fever as directed by your caregiver.  Do not remove your bandage (dressing) or get your incision wet until your surgeon gives you permission. In the event that your dressing becomes wet, dirty, or starts to smell, change the dressing and call your surgeon for instructions as soon as possible.  Take showers. Do not take tub baths, swim, or do anything that may soak the wound until it is healed.  Resume your normal diet and activities as directed or allowed.  Avoid lifting any weight until you are instructed otherwise.  Use anti-itch antihistamine medicine as directed by your caregiver. The wound may itch when it is healing. Do not pick or scratch at the wound.  Follow up with your caregiver for stitch (suture) or staple removal as directed.  Drink enough fluids to keep your urine clear or pale yellow. SEEK MEDICAL CARE IF:   You have redness, swelling, or increasing pain in the wound that is not controlled with medicine.  You have drainage, blood, or pus coming from the wound that lasts longer than 1 day.  You develop muscle aches, chills, or a general ill feeling.  You notice a bad smell coming from the wound or dressing.  Your wound edges separate after the sutures, staples, or skin adhesive strips have been removed.  You develop persistent nausea or vomiting. SEEK IMMEDIATE MEDICAL CARE IF:   You have a fever.  You develop a rash.  You develop dizzy episodes or faint while standing.  You have difficulty breathing.  You develop any reaction or side effects to medicine given. MAKE SURE YOU:   Understand these instructions.  Will watch your condition.  Will get help  right away if you are not doing well or get worse.  Carpal Tunnel Release (Repair) Care After Refer to this sheet in the next few weeks. These discharge instructions provide you with general information on caring for yourself after you leave the hospital. Your caregiver may also give you specific instructions. Your treatment has been planned according to the most current medical practices available, but unavoidable complications sometimes occur. If you have any problems or questions after discharge, please call your caregiver. HOME CARE INSTRUCTIONS   Have a responsible person with you for 24 hours.  Do not drive a car or take public transportation for 24 hours.  Only take over-the-counter or prescription medicines for pain, discomfort, or fever as directed by your caregiver. Take them as directed.  You may put ice on the palm side of the affected wrist.  Put ice in a plastic bag.  Place a towel between your skin and the bag.  Leave the ice on for 15-20 minutes, 03-04 times per day.  If you were given a splint to keep your wrist from bending, use it as directed. It is important to wear the splint at night or as directed. Use the splint for as long as you have pain or numbness in your hand, arm, or wrist. This may take 1 to 2 months.  Keep your hand raised (elevated) above the level of your heart as much as possible. This keeps swelling down and helps with discomfort.  Change bandages (dressings) as directed.  Keep the wound clean  and dry. SEEK MEDICAL CARE IF:   You develop pain not relieved with medicines.  You develop numbness of your hand.  You develop bleeding from your surgical site.  You have an oral temperature above 102 F (38.9 C).  You develop redness or swelling of the surgical site.  You develop new, unexplained problems. SEEK IMMEDIATE MEDICAL CARE IF:   You develop a rash.  You have difficulty breathing.  You develop any reaction or side effects to  medicines given. MAKE SURE YOU:   Understand these instructions.  Will watch your condition.  Will get help right away if you are not doing well or get worse.

## 2013-05-20 NOTE — Op Note (Signed)
05/20/2013  8:06 AM  PATIENT:  Sonya [name]  73 y.o. female  PRE-OPERATIVE DIAGNOSIS:  Left carpal tunnel syndrome  POST-OPERATIVE DIAGNOSIS:  Left carpal tunnel syndrome  Findings: synovitis and median nerve compression with yellowing of the nerve   PROCEDURE:  Procedure(s): CARPAL TUNNEL RELEASE (Left)  SURGEON:  Surgeon(s) and Role:    * Soni Kegel E Yug Loria, MD - Primary  PHYSICIAN ASSISTANT:   ASSISTANTS: none   ANESTHESIA:   regional  EBL:  Total I/O In: 200 [I.V.:200] Out: -   BLOOD ADMINISTERED:none  DRAINS: none   LOCAL MEDICATIONS USED:  MARCAINE    and Amount: 10cc  SPECIMEN:  No Specimen  DISPOSITION OF SPECIMEN:  N/A  COUNTS:  YES  TOURNIQUET:  * Missing tourniquet times found for documented tourniquets in log:  [id number] *  DICTATION: .Dragon Dictation  PLAN OF CARE: Discharge to home after PACU  PATIENT DISPOSITION:  PACU - hemodynamically stable.   Delay start of Pharmacological VTE agent (>24hrs) due to surgical blood loss or risk of bleeding: not applicable  Surgical technique: The patient was identified in the preoperative holding area and the left carpal tunnel was marked as a surgical site. Chart update was completed and review of history was performed. The patient was taken to the operating room and given a Bier block. Routine sterile prep and drape. Timeout taken and completed.  The incision was made over the carpal tunnel in line with the radial side of the ring finger. The subcutaneous tissue was divided down to the palmar fascia. Blunt dissection was carried out distal to the transverse carpal ligament and then. The transverse carpal ligament was released sharply. The carpal tunnel contents were inspected found to be free of any other pathology. The median nerve was found to be compressed. The color was abnormal.  The wound was irrigated and closed with 3-0 nylon suture followed by injection with 10 cc of plain Marcaine 0.5%  Sterile  dressing applied   

## 2013-05-20 NOTE — Interval H&P Note (Signed)
History and Physical Interval Note:  05/20/2013 7:22 AM  Sonya [name]  has presented today for surgery, with the diagnosis of Left carpal tunnel syndrome  The various methods of treatment have been discussed with the patient and family. After consideration of risks, benefits and other options for treatment, the patient has consented to  Procedure(s): CARPAL TUNNEL RELEASE (Left) as a surgical intervention .  The patient's history has been reviewed, patient examined, no change in status, stable for surgery.  I have reviewed the patient's chart and labs.  Questions were answered to the patient's satisfaction.     Arther Abbott

## 2013-05-20 NOTE — Anesthesia Procedure Notes (Addendum)
Procedure Name: MAC Date/Time: 05/20/2013 7:30 AM Performed by: Vista Deck Pre-anesthesia Checklist: Patient identified, Emergency Drugs available, Suction available, Timeout performed and Patient being monitored Patient Re-evaluated:Patient Re-evaluated prior to inductionOxygen Delivery Method: Non-rebreather mask   Anesthesia Regional Block:  Bier block (IV Regional)  Pre-Anesthetic Checklist: ,, timeout performed, Correct Patient, Correct Site, Correct Laterality, Correct Procedure,, site marked, surgical consent,, at surgeon's request  Laterality: Left     Needles:   Needle Type: Other      Needle Gauge: 22 and 22 G    Additional Needles: Bier block (IV Regional)  Nerve Stimulator or Paresthesia:   Additional Responses:  Pulse checked post tourniquet inflation. IV NSL discontinued post injection. Narrative:  Start time: 05/20/2013 7:35 AM End time: 05/20/2013 7:38 AM  Performed by: Personally

## 2013-05-20 NOTE — Brief Op Note (Signed)
05/20/2013  8:06 AM  PATIENT:  Sonya [name]  73 y.o. female  PRE-OPERATIVE DIAGNOSIS:  Left carpal tunnel syndrome  POST-OPERATIVE DIAGNOSIS:  Left carpal tunnel syndrome  Findings: synovitis and median nerve compression with yellowing of the nerve   PROCEDURE:  Procedure(s): CARPAL TUNNEL RELEASE (Left)  SURGEON:  Surgeon(s) and Role:    * Carole Civil, MD - Primary  PHYSICIAN ASSISTANT:   ASSISTANTS: none   ANESTHESIA:   regional  EBL:  Total I/O In: 200 [I.V.:200] Out: -   BLOOD ADMINISTERED:none  DRAINS: none   LOCAL MEDICATIONS USED:  MARCAINE    and Amount: 10cc  SPECIMEN:  No Specimen  DISPOSITION OF SPECIMEN:  N/A  COUNTS:  YES  TOURNIQUET:  * Missing tourniquet times found for documented tourniquets in log:  [id number] *  DICTATION: .Dragon Dictation  PLAN OF CARE: Discharge to home after PACU  PATIENT DISPOSITION:  PACU - hemodynamically stable.   Delay start of Pharmacological VTE agent (>24hrs) due to surgical blood loss or risk of bleeding: not applicable  Surgical technique: The patient was identified in the preoperative holding area and the left carpal tunnel was marked as a surgical site. Chart update was completed and review of history was performed. The patient was taken to the operating room and given a Bier block. Routine sterile prep and drape. Timeout taken and completed.  The incision was made over the carpal tunnel in line with the radial side of the ring finger. The subcutaneous tissue was divided down to the palmar fascia. Blunt dissection was carried out distal to the transverse carpal ligament and then. The transverse carpal ligament was released sharply. The carpal tunnel contents were inspected found to be free of any other pathology. The median nerve was found to be compressed. The color was abnormal.  The wound was irrigated and closed with 3-0 nylon suture followed by injection with 10 cc of plain Marcaine 0.5%  Sterile  dressing applied

## 2013-05-20 NOTE — Anesthesia Postprocedure Evaluation (Signed)
Anesthesia Post Note  Patient: Sonya [name]  Procedure(s) Performed: Procedure(s) (LRB): CARPAL TUNNEL RELEASE (Left)  Anesthesia type: MAC  Patient location: PACU  Post pain: Pain level controlled  Post assessment: Post-op Vital signs reviewed, Patient's Cardiovascular Status Stable, Respiratory Function Stable, Patent Airway, No signs of Nausea or vomiting and Pain level controlled  Last Vitals:  Filed Vitals:   05/20/13 0811  BP: 103/47  Pulse: 82  Temp: 36.7 C  Resp: 15    Post vital signs: Reviewed and stable  Level of consciousness: awake and alert   Complications: No apparent anesthesia complications

## 2013-05-20 NOTE — Anesthesia Preprocedure Evaluation (Signed)
Anesthesia Evaluation  Patient identified by MRN, date of birth, ID band Patient awake    Reviewed: Allergy & Precautions, H&P , NPO status , Patient's Chart, lab work & pertinent test results  Airway Mallampati: II TM Distance: >3 FB Neck ROM: Full    Dental  (+) Teeth Intact   Pulmonary former smoker,  breath sounds clear to auscultation        Cardiovascular negative cardio ROS  Rhythm:Regular Rate:Normal     Neuro/Psych  Neuromuscular disease    GI/Hepatic GERD- (IBS)  Controlled,  Endo/Other    Renal/GU      Musculoskeletal   Abdominal   Peds  Hematology   Anesthesia Other Findings   Reproductive/Obstetrics                           Anesthesia Physical Anesthesia Plan  ASA: II  Anesthesia Plan: MAC and Bier Block   Post-op Pain Management:    Induction: Intravenous  Airway Management Planned: Nasal Cannula  Additional Equipment:   Intra-op Plan:   Post-operative Plan:   Informed Consent: I have reviewed the patients History and Physical, chart, labs and discussed the procedure including the risks, benefits and alternatives for the proposed anesthesia with the patient or authorized representative who has indicated his/her understanding and acceptance.     Plan Discussed with:   Anesthesia Plan Comments:         Anesthesia Quick Evaluation

## 2013-05-23 ENCOUNTER — Encounter (HOSPITAL_COMMUNITY): Payer: Self-pay | Admitting: Orthopedic Surgery

## 2013-05-23 ENCOUNTER — Ambulatory Visit (INDEPENDENT_AMBULATORY_CARE_PROVIDER_SITE_OTHER): Payer: Self-pay | Admitting: Orthopedic Surgery

## 2013-05-23 VITALS — BP 96/61 | Ht 65.0 in | Wt 150.0 lb

## 2013-05-23 DIAGNOSIS — G5600 Carpal tunnel syndrome, unspecified upper limb: Secondary | ICD-10-CM

## 2013-05-23 DIAGNOSIS — G5602 Carpal tunnel syndrome, left upper limb: Secondary | ICD-10-CM

## 2013-05-23 DIAGNOSIS — Z9889 Other specified postprocedural states: Secondary | ICD-10-CM

## 2013-05-23 NOTE — Patient Instructions (Addendum)
Keep dry Exercise hand  Do not lift anything heavy

## 2013-05-23 NOTE — Progress Notes (Signed)
Patient ID: Sonya [name], female   DOB: 01-14-1941, 73 y.o.   MRN: [id number] Chief Complaint  Patient presents with  . Follow-up    Post op 1 Carpal Tunnel Release DOS 05/20/13   Postoperative visit  Procedure: (Left  CTR)  Date of procedure January 9  Diagnosis Carpal Tunnel Syndrome   Operative findings Median Nerve compression  Appearance of incision clean  ROM good  Patient complaints none  Plan stitches out postop day 10

## 2013-05-30 ENCOUNTER — Ambulatory Visit (INDEPENDENT_AMBULATORY_CARE_PROVIDER_SITE_OTHER): Payer: Self-pay | Admitting: Orthopedic Surgery

## 2013-05-30 VITALS — BP 109/73 | Ht 65.0 in | Wt 150.0 lb

## 2013-05-30 DIAGNOSIS — Z9889 Other specified postprocedural states: Secondary | ICD-10-CM

## 2013-05-30 NOTE — Progress Notes (Signed)
Patient ID: Sonya [name], female   DOB: 1940-12-16, 73 y.o.   MRN: [id number] Chief Complaint  Patient presents with  . Follow-up    Post op 2 Carpal tunnel release DOS 05/20/13    1/2 of the sutures were removed   The rest of the sutures can come out thurs   Wound looks clean

## 2013-05-30 NOTE — Patient Instructions (Signed)
Lifting restriction less than 5 lbs

## 2013-05-31 ENCOUNTER — Encounter (INDEPENDENT_AMBULATORY_CARE_PROVIDER_SITE_OTHER): Payer: Self-pay | Admitting: Internal Medicine

## 2013-05-31 ENCOUNTER — Ambulatory Visit (INDEPENDENT_AMBULATORY_CARE_PROVIDER_SITE_OTHER): Payer: Medicare Other | Admitting: Internal Medicine

## 2013-05-31 VITALS — BP 140/70 | Temp 97.9°F | Ht 65.0 in | Wt 148.1 lb

## 2013-05-31 DIAGNOSIS — R1310 Dysphagia, unspecified: Secondary | ICD-10-CM | POA: Insufficient documentation

## 2013-05-31 DIAGNOSIS — K219 Gastro-esophageal reflux disease without esophagitis: Secondary | ICD-10-CM | POA: Insufficient documentation

## 2013-05-31 MED ORDER — PANTOPRAZOLE SODIUM 40 MG PO TBEC: 40.0000 mg | DELAYED_RELEASE_TABLET | Freq: Every day | ORAL | Status: DC

## 2013-05-31 NOTE — Progress Notes (Signed)
Subjective:     Patient ID: Sonya [name], female   DOB: 1940-11-16, 73 y.o.   MRN: [id number]  [facility] Presents today with IBS from time to time. She also has c/o dysphagia. Occurs about once a month. She was operated on the first of January 9  for Carpal Tunnel.  She developed diarrhea. She saw Dr. Romona Curls and was placed on Cholestryramine daily. No diarrhea now.   Her diarrhea resolved about 5 days ago. . No recent antibiotics.  Celiac panel is negative. Appetite is good. No weight loss.  Having a BM x 1 daily. No melena or bright red rectal bleeding.  07/25/2010 Colonoscopy: INDICATIONS: Sonya [name] is a 73year-old Caucasian female who is here for  high-risk screening colonoscopy. Last colonoscopy was about 5 years ago  with removal of 2 cecal polyps. They were non adenomatous. Her family  history is positive for colon carcinoma in her mother at age 60,  maternal grandmother had died of colon carcinoma at age 80 and maternal  grandfather also had colon carcinoma and it was in his 58s.   FINAL DIAGNOSES:  1. Examination performed to cecum.  2. Few scattered diverticula throughout the colon.  3. Small external hemorrhoids.   Review of Systems see hpi Current Outpatient Prescriptions  Medication Sig Dispense Refill  . raloxifene (EVISTA) 60 MG tablet Take 60 mg by mouth daily.       No current facility-administered medications for this visit.   Past Medical History  Diagnosis Date  . Cancer     Left Breast  . Breast cancer    Allergies  Allergen Reactions  . Celebrex [Celecoxib]     REACTION: rash, sob, edema, reddness        Objective:   Physical Exam  Filed Vitals:   05/31/13 1359  BP: 140/70  Temp: 97.9 F (36.6 C)  Height: 5\' 5"  (1.651 m)  Weight: 148 lb 1.6 oz (67.178 kg)  Alert and oriented. Skin warm and dry. Oral mucosa is moist.   . Sclera anicteric, conjunctivae is pink. Thyroid not enlarged. No cervical lymphadenopathy. Lungs clear. Heart regular rate and  rhythm.  Abdomen is soft. Bowel sounds are positive. No hepatomegaly. No abdominal masses felt. No tenderness.  No edema to lower extremities.        Assessment:     GERD/Dysphagia. Dysphagia occurs about once a month. Occasionally has acid reflux. Not on a PPI at this time. Dr. Laural Golden in with patient.     Plan:   Stop Cholestyramine. Rx for Protonix eprescribed to her pharmacy. PR in about 8 weeks. If any problems please call our office.

## 2013-05-31 NOTE — Patient Instructions (Signed)
Call in about 8 weeks and let us know how you are doing

## 2013-06-02 ENCOUNTER — Ambulatory Visit (INDEPENDENT_AMBULATORY_CARE_PROVIDER_SITE_OTHER): Payer: Self-pay | Admitting: Orthopedic Surgery

## 2013-06-02 ENCOUNTER — Encounter: Payer: Self-pay | Admitting: Orthopedic Surgery

## 2013-06-02 VITALS — BP 129/71 | Ht 65.0 in | Wt 150.0 lb

## 2013-06-02 DIAGNOSIS — Z9889 Other specified postprocedural states: Secondary | ICD-10-CM

## 2013-06-02 DIAGNOSIS — G5600 Carpal tunnel syndrome, unspecified upper limb: Secondary | ICD-10-CM

## 2013-06-02 DIAGNOSIS — G5602 Carpal tunnel syndrome, left upper limb: Secondary | ICD-10-CM

## 2013-06-02 NOTE — Progress Notes (Signed)
Patient ID: Sonya [name], female   DOB: 11-18-1940, 73 y.o.   MRN: [id number] Chief Complaint  Patient presents with  . Follow-up    Post op 2 left carpal tunnel release DOS 05/20/13    The remaining sutures were removed the wound looks clean we applied skin seal and advised active range of motion followup in 4 weeks

## 2013-06-16 ENCOUNTER — Telehealth (INDEPENDENT_AMBULATORY_CARE_PROVIDER_SITE_OTHER): Payer: Self-pay | Admitting: *Deleted

## 2013-06-16 NOTE — Telephone Encounter (Signed)
I have asked her to try Imodium and to call me tomorrow. If she continues to have diarrhea, will do stool studies.

## 2013-06-16 NOTE — Telephone Encounter (Signed)
Sonya [name] just seen [facility] on 05/31/2013 and was given a new medication. She did very well on it and now back with diarrhea with cramps. Her return phone number is [phone].

## 2013-06-30 ENCOUNTER — Ambulatory Visit (INDEPENDENT_AMBULATORY_CARE_PROVIDER_SITE_OTHER): Payer: Self-pay | Admitting: Orthopedic Surgery

## 2013-06-30 ENCOUNTER — Encounter: Payer: Self-pay | Admitting: Orthopedic Surgery

## 2013-06-30 DIAGNOSIS — Z9889 Other specified postprocedural states: Secondary | ICD-10-CM

## 2013-06-30 NOTE — Progress Notes (Signed)
Patient ID: Sonya [name], female   DOB: 01/28/1941, 73 y.o.   MRN: [id number]  No chief complaint on file.  Encounter Diagnosis  Name Primary?  . S/P carpal tunnel release Yes   There were no vitals taken for this visit.   The patient's carpal tunnel release on the left side is doing very well she has no complaints  her right carpal tunnel symptoms resolved with injection  Her left skin incision looks good she has no numbness or tingling she has full range of motion she will follow as needed

## 2013-06-30 NOTE — Patient Instructions (Signed)
Activities as tolerated. 

## 2013-08-01 ENCOUNTER — Telehealth (INDEPENDENT_AMBULATORY_CARE_PROVIDER_SITE_OTHER): Payer: Self-pay | Admitting: *Deleted

## 2013-08-01 NOTE — Telephone Encounter (Signed)
Sonya [name]'s 8 week progress report- She is doing well, no GERD problems and IBS is about 95% better. Will call when needed. Her return phone number is [phone].

## 2013-11-15 ENCOUNTER — Ambulatory Visit: Payer: Medicare Other | Admitting: Orthopedic Surgery

## 2013-12-06 ENCOUNTER — Ambulatory Visit (INDEPENDENT_AMBULATORY_CARE_PROVIDER_SITE_OTHER): Payer: Medicare Other | Admitting: Orthopedic Surgery

## 2013-12-06 DIAGNOSIS — M19042 Primary osteoarthritis, left hand: Secondary | ICD-10-CM

## 2013-12-06 DIAGNOSIS — M19049 Primary osteoarthritis, unspecified hand: Secondary | ICD-10-CM

## 2013-12-06 DIAGNOSIS — G5601 Carpal tunnel syndrome, right upper limb: Secondary | ICD-10-CM

## 2013-12-06 DIAGNOSIS — M19041 Primary osteoarthritis, right hand: Secondary | ICD-10-CM

## 2013-12-06 DIAGNOSIS — Z9889 Other specified postprocedural states: Secondary | ICD-10-CM

## 2013-12-06 DIAGNOSIS — G5600 Carpal tunnel syndrome, unspecified upper limb: Secondary | ICD-10-CM

## 2013-12-06 DIAGNOSIS — G5602 Carpal tunnel syndrome, left upper limb: Secondary | ICD-10-CM

## 2013-12-06 NOTE — Progress Notes (Signed)
Patient ID: Sonya [name], female   DOB: May 23, 1940, 73 y.o.   MRN: [id number] Chief Complaint  Patient presents with  . Follow-up    recheck bilateral hands    There were no vitals taken for this visit.  Recheck both hands status post left carpal tunnel release persistent right carpal tunnel syndrome status post injection in November on the right with good relief until a month ago. Pain paresthesias radiating up the arm night pain difficulty sleeping.  Mild weakness in the right hand as well.  Left hand dorsal pain midcarpal joint  Review of systems negative  She has tenderness over the right carpal tunnel positive Tinel's. As far as the hands go she is midcarpal tenderness normal range of motion of bilateral wrist joints with some decreased sensation at the tip of the left thumb and right fingertip sensory loss.  Inject right carpal tunnel  We discussed possible surgery if this fails to relieve all of her symptoms. I also told her that if we keep injecting and waiting and wanted his good response from the carpal tunnel release she agrees  And also put her on a topical arthritis medicine  Carpal Tunnel Injection Procedure Note  Pre-operative Diagnosis: right carpal tunnel syndrome  Post-operative Diagnosis: same  Indications: failure of conservative therapy  Procedure Details  Verbal consent for the procedure was obtained. After a sterile alcohol prep, the skin and subcutaneous layer overlying the carpal tunnel were anesthetized with Ethyl Chloride.  A needle was advanced into the carpal tunnel space, carefully avoiding the nerve.  Free flow into the carpal tunnel space was confirmed.  The tunnel was then injected with 1 ml of depomedrol. The area was cleansed with isopropyl alcohol and a dressing was applied.  Complications:  None; patient tolerated the procedure well.

## 2013-12-06 NOTE — Patient Instructions (Signed)
You have received a steroid shot. 15% of patients experience increased pain at the injection site with in the next 24 hours. This is best treated with ice and tylenol extra strength 2 tabs every 8 hours. If you are still having pain please call the office.    

## 2014-01-10 ENCOUNTER — Ambulatory Visit (INDEPENDENT_AMBULATORY_CARE_PROVIDER_SITE_OTHER): Payer: Medicare Other | Admitting: Orthopedic Surgery

## 2014-01-10 ENCOUNTER — Encounter: Payer: Self-pay | Admitting: Orthopedic Surgery

## 2014-01-10 VITALS — Resp 18 | Ht 65.0 in | Wt 150.0 lb

## 2014-01-10 DIAGNOSIS — G5601 Carpal tunnel syndrome, right upper limb: Secondary | ICD-10-CM

## 2014-01-10 DIAGNOSIS — M19049 Primary osteoarthritis, unspecified hand: Secondary | ICD-10-CM

## 2014-01-10 DIAGNOSIS — Z9889 Other specified postprocedural states: Secondary | ICD-10-CM

## 2014-01-10 DIAGNOSIS — M19042 Primary osteoarthritis, left hand: Principal | ICD-10-CM

## 2014-01-10 DIAGNOSIS — M19041 Primary osteoarthritis, right hand: Secondary | ICD-10-CM

## 2014-01-10 DIAGNOSIS — G5600 Carpal tunnel syndrome, unspecified upper limb: Secondary | ICD-10-CM

## 2014-01-10 NOTE — Progress Notes (Signed)
Re check  Problem stable   Encounter Diagnoses  Name Primary?  . S/P carpal tunnel release   . Carpal tunnel syndrome, right   . Primary osteoarthritis of both hands Yes   Chief Complaint  Patient presents with  . Follow-up    5 week recheck on CTS status post injection.    Today is a followup visit status post injection right carpal tunnel on 12/06/2013. The patient has complaints of slight tingling at the tips of the long and removed finger of the previously release left carpal tunnel. She also zoster arthritis of both hands treated with topical medication complains of stiffness in the mornings. She has occasional light pain in the right wrist and hand more than once a week.  Review of systems no neurologic symptoms in the cervical spine or radicular symptoms from the top down. She is oriented x3 today her vital signs are stable Ht 5\' 5"  (1.651 m)  Wt 150 lb (68.04 kg)  BMI 24.96 kg/m2 Mood is pleasant. We'll appearance meticulously groomed well-nourished normal development  Both hands show Heberden and Bouchard's nodes. Generalized normal range of motion. Normal motor strength normal skin no detectable sensory loss good pulse and perfusion  At this point the patient would like to continue on the current treatment adding nighttime bracing to control her nighttime symptoms. She is to continue the topical medication for the osteoarthritis and return in a month  Note the patient's husband is contemplating back surgery and she is cautious about having surgery in the short-term and K. she has to care for him after his surgery.

## 2014-02-06 ENCOUNTER — Other Ambulatory Visit (HOSPITAL_COMMUNITY): Payer: Self-pay | Admitting: Internal Medicine

## 2014-02-06 DIAGNOSIS — Z139 Encounter for screening, unspecified: Secondary | ICD-10-CM

## 2014-02-08 ENCOUNTER — Ambulatory Visit (INDEPENDENT_AMBULATORY_CARE_PROVIDER_SITE_OTHER): Payer: Medicare Other | Admitting: Orthopedic Surgery

## 2014-02-08 VITALS — BP 128/75 | Ht 65.0 in | Wt 150.0 lb

## 2014-02-08 DIAGNOSIS — Z9889 Other specified postprocedural states: Secondary | ICD-10-CM

## 2014-02-08 DIAGNOSIS — M180 Bilateral primary osteoarthritis of first carpometacarpal joints: Secondary | ICD-10-CM

## 2014-02-08 DIAGNOSIS — M19049 Primary osteoarthritis, unspecified hand: Secondary | ICD-10-CM

## 2014-02-08 NOTE — Progress Notes (Signed)
Chief Complaint  Patient presents with  . Follow-up    1 month recheck on bilateral CTS.    The patient has had injection and carpal tunnel release surgery on the left injection on the right still complains of bilateral discomfort in her hand associated with stiffness in the mornings as well as numbness and tingling in the long and ring finger of the left hand some pain over the palm of the left wrist  She indicates that she would like to get a second opinion and we have decided to get Dr. Veronia Beets to evaluate her for arthritis of both hands and for residual symptoms in her left carpal tunnel.

## 2014-02-09 ENCOUNTER — Ambulatory Visit: Payer: Medicare Other | Admitting: Orthopedic Surgery

## 2014-02-14 ENCOUNTER — Telehealth: Payer: Self-pay | Admitting: Orthopedic Surgery

## 2014-02-14 NOTE — Telephone Encounter (Signed)
Referral faxed to [facility] 02/13/14.  Patient aware (she had called to check status) Per follow up to this office, Pam confirmed receipt of the referral; states it is on Dr's desk for review, and that she will contact patient upon the doctor's response.

## 2014-02-17 NOTE — Telephone Encounter (Signed)
As per follow/up call 02/17/14 to check status of referral, Delsa Sale states that Pam is getting with Dr Amedeo Plenty to review, and patient will be called with an appointment as soon as possible.  Called back to patient, left voice message.

## 2014-02-20 ENCOUNTER — Ambulatory Visit (HOSPITAL_COMMUNITY)
Admission: RE | Admit: 2014-02-20 | Discharge: 2014-02-20 | Disposition: A | Discharge status: Home / Self Care | Payer: Medicare Other | Source: Ambulatory Visit | Attending: Internal Medicine | Admitting: Internal Medicine

## 2014-02-20 DIAGNOSIS — Z1231 Encounter for screening mammogram for malignant neoplasm of breast: Secondary | ICD-10-CM | POA: Diagnosis present

## 2014-02-20 DIAGNOSIS — Z139 Encounter for screening, unspecified: Secondary | ICD-10-CM

## 2014-02-23 NOTE — Telephone Encounter (Signed)
Has any response been received regarding referral appointment? Notes had been faxed - not seeing in referral entry.

## 2014-02-24 NOTE — Telephone Encounter (Signed)
Patient has appointment 03/23/14 @ 5:30 with Dr Amedeo Plenty

## 2014-03-30 ENCOUNTER — Telehealth: Payer: Self-pay | Admitting: Orthopedic Surgery

## 2014-03-30 NOTE — Telephone Encounter (Signed)
ok 

## 2014-03-30 NOTE — Telephone Encounter (Signed)
Patient is calling today asking to see Dr. Aline Brochure again, Patient had appointment 03/23/14 @ 5:30 with Dr Amedeo Plenty and couldn't make it due to a family wedding and the office told her that she needed a new referral from [place] and it would be January before they could work her in again and she states she just wants to forget about seeing Dr. Amedeo Plenty and continue following Dr. Aline Brochure, she is currently c/o hand pain and wants to make an appointment, please advise?

## 2014-03-31 NOTE — Telephone Encounter (Signed)
Patient is aware and is scheduled for Dec 1st at 8:45

## 2014-04-11 ENCOUNTER — Ambulatory Visit (INDEPENDENT_AMBULATORY_CARE_PROVIDER_SITE_OTHER): Payer: Medicare Other | Admitting: Orthopedic Surgery

## 2014-04-11 ENCOUNTER — Encounter: Payer: Self-pay | Admitting: Orthopedic Surgery

## 2014-04-11 VITALS — BP 102/67 | Ht 65.0 in | Wt 150.0 lb

## 2014-04-11 DIAGNOSIS — Z9889 Other specified postprocedural states: Secondary | ICD-10-CM

## 2014-04-11 DIAGNOSIS — M180 Bilateral primary osteoarthritis of first carpometacarpal joints: Secondary | ICD-10-CM

## 2014-04-11 DIAGNOSIS — G5601 Carpal tunnel syndrome, right upper limb: Secondary | ICD-10-CM

## 2014-04-11 MED ORDER — DICLOFENAC POTASSIUM 50 MG PO TABS: 50.0000 mg | ORAL_TABLET | Freq: Two times a day (BID) | ORAL | Status: DC

## 2014-04-11 NOTE — Progress Notes (Signed)
Subjective:     Patient ID: Sonya [name], female   DOB: 07-Aug-1940, 73 y.o.   MRN: [id number] Chief Complaint  Patient presents with  . Follow-up    1 month recheck BILATERAL CTS, Rt>Lt    HPI The patient had carpal tunnel release on the left side. She is having some intermittent symptoms in the thumb and index finger. She also has continued carpal tunnel symptoms on the right with forearm pain and numbness and tingling at the tips of the finger and she can't play golf. She had an injection again in the right side which lasted several weeks. She is interesting in another injection. She was unable to get the hand surgery consult for the arthritis she's been using topical medication with limited relief    Review of Systems Review of systems she continues to not have neurologic or cervical spine or radicular symptoms in either arm. She does have weakness on grip on the right. She has stiffness in her hands in the morning.  Past Medical History  Diagnosis Date  . Cancer     Left Breast  . Breast cancer         Objective:   Physical Exam BP 102/67 mmHg  Ht 5\' 5"  (1.651 m)  Wt 150 lb (68.04 kg)  BMI 24.96 kg/m2 Again we note the bilateral Heberden nodes and Bouchard nodes in the small digits. She has decreased grip strength on the right and tenderness in the forearm bilaterally indicating tenosynovitis.    Assessment:     Encounter Diagnoses  Name Primary?  . S/P carpal tunnel release Yes  . Primary osteoarthritis of both first carpometacarpal joints   . Carpal tunnel syndrome, right     Carpal tunnel syndrome right worsening Hand osteoarthritis worsening     Plan:     Inject right carpal tunnel Meds ordered this encounter  Medications  . diclofenac (CATAFLAM) 50 MG tablet    Sig: Take 1 tablet (50 mg total) by mouth 2 (two) times daily.    Dispense:  60 tablet    Refill:  2   Appropriate precautions regarding NSAIDs given  Procedure note  Injection  Verbal  consent was obtained to inject the  right carpal tunnel  Timeout procedure was completed to confirm injection site  Diagnosis right carpal tunnel syndrome  Medications used Depo-Medrol 40 mg 1 cc Lidocaine 1% plain 3 cc  Anesthesia was provided by ethyl chloride spray  Prep was performed with alcohol  Technique of injection  the skin was prepped and then ethyl chloride was used to anesthetize the skin and a 15-gauge needle was used to inject the carpal tunnel   No complications were noted

## 2014-04-11 NOTE — Patient Instructions (Addendum)
Medication sent to your pharmacy    Joint Injection Care After Refer to this sheet in the next few days. These instructions provide you with information on caring for yourself after you have had a joint injection. Your caregiver also may give you more specific instructions. Your treatment has been planned according to current medical practices, but problems sometimes occur. Call your caregiver if you have any problems or questions after your procedure. After any type of joint injection, it is not uncommon to experience:  Soreness, swelling, or bruising around the injection site.  Mild numbness, tingling, or weakness around the injection site caused by the numbing medicine used before or with the injection. It also is possible to experience the following effects associated with the specific agent after injection:  Iodine-based contrast agents:  Allergic reaction (itching, hives, widespread redness, and swelling beyond the injection site).  Corticosteroids (These effects are rare.):  Allergic reaction.  Increased blood sugar levels (If you have diabetes and you notice that your blood sugar levels have increased, notify your caregiver).  Increased blood pressure levels.  Mood swings.  Hyaluronic acid in the use of viscosupplementation.  Temporary heat or redness.  Temporary rash and itching.  Increased fluid accumulation in the injected joint. These effects all should resolve within a day after your procedure.  HOME CARE INSTRUCTIONS  Limit yourself to light activity the day of your procedure. Avoid lifting heavy objects, bending, stooping, or twisting.  Take prescription or over-the-counter pain medication as directed by your caregiver.  You may apply ice to your injection site to reduce pain and swelling the day of your procedure. Ice may be applied 03-04 times:  Put ice in a plastic bag.  Place a towel between your skin and the bag.  Leave the ice on for no longer than  15-20 minutes each time. SEEK IMMEDIATE MEDICAL CARE IF:   Pain and swelling get worse rather than better or extend beyond the injection site.  Numbness does not go away.  Blood or fluid continues to leak from the injection site.  You have chest pain.  You have swelling of your face or tongue.  You have trouble breathing or you become dizzy.  You develop a fever, chills, or severe tenderness at the injection site that last longer than 1 day. MAKE SURE YOU:  Understand these instructions.  Watch your condition.  Get help right away if you are not doing well or if you get worse. Document Released: 01/09/2011 Document Revised: 07/21/2011 Document Reviewed: 01/09/2011 [facility] Patient Information 2015 [place], [place]. This information is not intended to replace advice given to you by your health care provider. Make sure you discuss any questions you have with your health care provider.

## 2014-04-17 ENCOUNTER — Telehealth: Payer: Self-pay | Admitting: Orthopedic Surgery

## 2014-04-17 NOTE — Telephone Encounter (Signed)
Patient is calling stating that she thinks she has had an allergic reaction to the diclofenac (CATAFLAM) 50 MG tablet her hand is itching and she is having diarrhea, please advise?

## 2014-04-17 NOTE — Telephone Encounter (Signed)
STOP  START IBUPROFEN 400 MG TID

## 2014-04-17 NOTE — Telephone Encounter (Signed)
Routing to DR [employer]

## 2014-04-17 NOTE — Telephone Encounter (Signed)
Called patient, no answer, left vm 

## 2014-04-19 NOTE — Telephone Encounter (Signed)
Patient aware.

## 2014-05-19 DIAGNOSIS — C7650 Malignant neoplasm of unspecified lower limb: Secondary | ICD-10-CM | POA: Diagnosis not present

## 2014-05-19 DIAGNOSIS — M810 Age-related osteoporosis without current pathological fracture: Secondary | ICD-10-CM | POA: Diagnosis not present

## 2014-05-19 DIAGNOSIS — K589 Irritable bowel syndrome without diarrhea: Secondary | ICD-10-CM | POA: Diagnosis not present

## 2014-05-19 DIAGNOSIS — Z79899 Other long term (current) drug therapy: Secondary | ICD-10-CM | POA: Diagnosis not present

## 2014-05-19 DIAGNOSIS — C50919 Malignant neoplasm of unspecified site of unspecified female breast: Secondary | ICD-10-CM | POA: Diagnosis not present

## 2014-05-23 ENCOUNTER — Encounter: Payer: Self-pay | Admitting: Orthopedic Surgery

## 2014-05-23 ENCOUNTER — Ambulatory Visit (INDEPENDENT_AMBULATORY_CARE_PROVIDER_SITE_OTHER): Payer: Medicare Other | Admitting: Orthopedic Surgery

## 2014-05-23 VITALS — BP 126/74 | Ht 65.0 in | Wt 150.0 lb

## 2014-05-23 DIAGNOSIS — M19041 Primary osteoarthritis, right hand: Secondary | ICD-10-CM

## 2014-05-23 DIAGNOSIS — Z9889 Other specified postprocedural states: Secondary | ICD-10-CM | POA: Diagnosis not present

## 2014-05-23 DIAGNOSIS — M180 Bilateral primary osteoarthritis of first carpometacarpal joints: Secondary | ICD-10-CM | POA: Diagnosis not present

## 2014-05-23 DIAGNOSIS — M19042 Primary osteoarthritis, left hand: Secondary | ICD-10-CM | POA: Diagnosis not present

## 2014-05-23 NOTE — Patient Instructions (Signed)
We will refer you to Dr Erlinda Hong

## 2014-05-23 NOTE — Progress Notes (Signed)
Patient ID: Sonya [name], female   DOB: 08/30/1940, 74 y.o.   MRN: [id number] 74 year old female status post carpal tunnel release presents with continued ongoing pain in her hands. We did a left carpal tunnel release we've repeated a right carpal tunnel injection with good result. She still has ongoing symptoms in the left upper extremity which are occasional. However she has aching stiffness and pain in both hands and also in the right wrist  I have treated her with anti-inflammatories but her irritable bowel syndrome will not allow to take those. We also tried a topical combination cream which didn't help  I recommend she see a hand surgeon for further recommendations treatment.

## 2014-05-25 ENCOUNTER — Other Ambulatory Visit: Payer: Self-pay | Admitting: *Deleted

## 2014-05-25 ENCOUNTER — Telehealth: Payer: Self-pay | Admitting: *Deleted

## 2014-05-25 DIAGNOSIS — M25541 Pain in joints of right hand: Secondary | ICD-10-CM

## 2014-05-25 DIAGNOSIS — M25542 Pain in joints of left hand: Principal | ICD-10-CM

## 2014-05-25 NOTE — Telephone Encounter (Signed)
Referral sent to Dr Erlinda Hong 05/24/14

## 2014-05-26 DIAGNOSIS — Z23 Encounter for immunization: Secondary | ICD-10-CM | POA: Diagnosis not present

## 2014-05-26 DIAGNOSIS — Z0001 Encounter for general adult medical examination with abnormal findings: Secondary | ICD-10-CM | POA: Diagnosis not present

## 2014-05-30 DIAGNOSIS — M79642 Pain in left hand: Secondary | ICD-10-CM | POA: Diagnosis not present

## 2014-05-30 DIAGNOSIS — G5601 Carpal tunnel syndrome, right upper limb: Secondary | ICD-10-CM | POA: Diagnosis not present

## 2014-05-30 DIAGNOSIS — M659 Synovitis and tenosynovitis, unspecified: Secondary | ICD-10-CM | POA: Diagnosis not present

## 2014-05-30 DIAGNOSIS — M79641 Pain in right hand: Secondary | ICD-10-CM | POA: Diagnosis not present

## 2014-05-30 NOTE — Telephone Encounter (Signed)
Has appt with Dr Erlinda Hong 05/30/14 @ 2:15

## 2014-06-05 ENCOUNTER — Other Ambulatory Visit: Payer: Self-pay | Admitting: Orthopaedic Surgery

## 2014-06-05 DIAGNOSIS — M25531 Pain in right wrist: Secondary | ICD-10-CM

## 2014-06-09 DIAGNOSIS — R202 Paresthesia of skin: Secondary | ICD-10-CM | POA: Diagnosis not present

## 2014-06-17 ENCOUNTER — Ambulatory Visit
Admission: RE | Admit: 2014-06-17 | Discharge: 2014-06-17 | Disposition: A | Discharge status: Home / Self Care | Payer: Medicare Other | Source: Ambulatory Visit | Attending: Orthopaedic Surgery | Admitting: Orthopaedic Surgery

## 2014-06-17 DIAGNOSIS — M25531 Pain in right wrist: Secondary | ICD-10-CM

## 2014-06-20 DIAGNOSIS — G5601 Carpal tunnel syndrome, right upper limb: Secondary | ICD-10-CM | POA: Diagnosis not present

## 2014-06-20 DIAGNOSIS — M659 Synovitis and tenosynovitis, unspecified: Secondary | ICD-10-CM | POA: Diagnosis not present

## 2014-07-03 ENCOUNTER — Other Ambulatory Visit (HOSPITAL_BASED_OUTPATIENT_CLINIC_OR_DEPARTMENT_OTHER): Payer: Self-pay | Admitting: Orthopaedic Surgery

## 2014-07-03 DIAGNOSIS — G5601 Carpal tunnel syndrome, right upper limb: Secondary | ICD-10-CM | POA: Diagnosis not present

## 2014-07-13 DIAGNOSIS — Z23 Encounter for immunization: Secondary | ICD-10-CM | POA: Diagnosis not present

## 2014-07-17 ENCOUNTER — Encounter (HOSPITAL_BASED_OUTPATIENT_CLINIC_OR_DEPARTMENT_OTHER): Payer: Self-pay | Admitting: *Deleted

## 2014-07-17 NOTE — Progress Notes (Signed)
No labs needed

## 2014-07-19 ENCOUNTER — Ambulatory Visit (HOSPITAL_BASED_OUTPATIENT_CLINIC_OR_DEPARTMENT_OTHER): Payer: Medicare Other | Admitting: Certified Registered"

## 2014-07-19 ENCOUNTER — Encounter (HOSPITAL_BASED_OUTPATIENT_CLINIC_OR_DEPARTMENT_OTHER): Payer: Self-pay | Admitting: Certified Registered"

## 2014-07-19 ENCOUNTER — Encounter (HOSPITAL_BASED_OUTPATIENT_CLINIC_OR_DEPARTMENT_OTHER)
Admission: RE | Disposition: A | Discharge status: Home / Self Care | Payer: Self-pay | Source: Ambulatory Visit | Attending: Orthopaedic Surgery

## 2014-07-19 ENCOUNTER — Ambulatory Visit (HOSPITAL_BASED_OUTPATIENT_CLINIC_OR_DEPARTMENT_OTHER)
Admission: RE | Admit: 2014-07-19 | Discharge: 2014-07-19 | Disposition: A | Discharge status: Home / Self Care | Payer: Medicare Other | Source: Ambulatory Visit | Attending: Orthopaedic Surgery | Admitting: Orthopaedic Surgery

## 2014-07-19 DIAGNOSIS — Z85820 Personal history of malignant melanoma of skin: Secondary | ICD-10-CM | POA: Insufficient documentation

## 2014-07-19 DIAGNOSIS — Z9889 Other specified postprocedural states: Secondary | ICD-10-CM | POA: Insufficient documentation

## 2014-07-19 DIAGNOSIS — Z9012 Acquired absence of left breast and nipple: Secondary | ICD-10-CM | POA: Diagnosis not present

## 2014-07-19 DIAGNOSIS — Z87891 Personal history of nicotine dependence: Secondary | ICD-10-CM | POA: Diagnosis not present

## 2014-07-19 DIAGNOSIS — M65831 Other synovitis and tenosynovitis, right forearm: Secondary | ICD-10-CM | POA: Insufficient documentation

## 2014-07-19 DIAGNOSIS — M1990 Unspecified osteoarthritis, unspecified site: Secondary | ICD-10-CM | POA: Diagnosis not present

## 2014-07-19 DIAGNOSIS — K589 Irritable bowel syndrome without diarrhea: Secondary | ICD-10-CM | POA: Insufficient documentation

## 2014-07-19 DIAGNOSIS — M65841 Other synovitis and tenosynovitis, right hand: Secondary | ICD-10-CM | POA: Diagnosis not present

## 2014-07-19 DIAGNOSIS — G5601 Carpal tunnel syndrome, right upper limb: Secondary | ICD-10-CM | POA: Diagnosis not present

## 2014-07-19 DIAGNOSIS — K219 Gastro-esophageal reflux disease without esophagitis: Secondary | ICD-10-CM | POA: Diagnosis not present

## 2014-07-19 DIAGNOSIS — M659 Synovitis and tenosynovitis, unspecified: Secondary | ICD-10-CM | POA: Diagnosis not present

## 2014-07-19 DIAGNOSIS — Z79899 Other long term (current) drug therapy: Secondary | ICD-10-CM | POA: Diagnosis not present

## 2014-07-19 DIAGNOSIS — Z853 Personal history of malignant neoplasm of breast: Secondary | ICD-10-CM | POA: Diagnosis not present

## 2014-07-19 HISTORY — DX: Noninfective gastroenteritis and colitis, unspecified: K52.9

## 2014-07-19 HISTORY — DX: Irritable bowel syndrome, unspecified: K58.9

## 2014-07-19 HISTORY — DX: Unspecified osteoarthritis, unspecified site: M19.90

## 2014-07-19 HISTORY — DX: Gastro-esophageal reflux disease without esophagitis: K21.9

## 2014-07-19 HISTORY — PX: CARPAL TUNNEL RELEASE: SHX101

## 2014-07-19 HISTORY — PX: TENOSYNOVECTOMY: SHX6110

## 2014-07-19 LAB — POCT HEMOGLOBIN-HEMACUE: Hemoglobin: 15.3 g/dL — ABNORMAL HIGH (ref 12.0–15.0)

## 2014-07-19 SURGERY — CARPAL TUNNEL RELEASE
Anesthesia: General | Site: Wrist | Laterality: Right

## 2014-07-19 MED ORDER — FENTANYL CITRATE 0.05 MG/ML IJ SOLN
INTRAMUSCULAR | Status: DC | PRN
  Administered 2014-07-19: 25 ug via INTRAVENOUS
  Administered 2014-07-19: 50 ug via INTRAVENOUS

## 2014-07-19 MED ORDER — DEXAMETHASONE SODIUM PHOSPHATE 10 MG/ML IJ SOLN
INTRAMUSCULAR | Status: DC | PRN
  Administered 2014-07-19: 10 mg via INTRAVENOUS

## 2014-07-19 MED ORDER — MIDAZOLAM HCL 2 MG/2ML IJ SOLN
1.0000 mg | INTRAMUSCULAR | Status: DC | PRN
Start: 2014-07-19 — End: 2014-07-19

## 2014-07-19 MED ORDER — PROPOFOL 10 MG/ML IV BOLUS
INTRAVENOUS | Status: DC | PRN
  Administered 2014-07-19: 120 mg via INTRAVENOUS

## 2014-07-19 MED ORDER — FENTANYL CITRATE 0.05 MG/ML IJ SOLN
INTRAMUSCULAR | Status: AC
  Filled 2014-07-19: qty 4

## 2014-07-19 MED ORDER — EPHEDRINE SULFATE 50 MG/ML IJ SOLN
INTRAMUSCULAR | Status: DC | PRN
  Administered 2014-07-19 (×3): 10 mg via INTRAVENOUS

## 2014-07-19 MED ORDER — OXYCODONE HCL 5 MG PO TABS
5.0000 mg | ORAL_TABLET | Freq: Once | ORAL | Status: DC | PRN
Start: 2014-07-19 — End: 2014-07-19

## 2014-07-19 MED ORDER — LACTATED RINGERS IV SOLN: INTRAVENOUS | Status: DC

## 2014-07-19 MED ORDER — BUPIVACAINE HCL (PF) 0.25 % IJ SOLN
INTRAMUSCULAR | Status: DC | PRN
  Administered 2014-07-19: 10 mL

## 2014-07-19 MED ORDER — HYDROMORPHONE HCL 1 MG/ML IJ SOLN
INTRAMUSCULAR | Status: AC
  Filled 2014-07-19: qty 1

## 2014-07-19 MED ORDER — LIDOCAINE HCL (CARDIAC) 20 MG/ML IV SOLN
INTRAVENOUS | Status: DC | PRN
  Administered 2014-07-19: 60 mg via INTRAVENOUS

## 2014-07-19 MED ORDER — ONDANSETRON HCL 4 MG/2ML IJ SOLN
INTRAMUSCULAR | Status: DC | PRN
  Administered 2014-07-19: 4 mg via INTRAVENOUS

## 2014-07-19 MED ORDER — PROMETHAZINE HCL 25 MG/ML IJ SOLN
INTRAMUSCULAR | Status: AC
  Filled 2014-07-19: qty 1

## 2014-07-19 MED ORDER — LACTATED RINGERS IV SOLN
INTRAVENOUS | Status: DC | PRN
  Administered 2014-07-19 (×2): via INTRAVENOUS

## 2014-07-19 MED ORDER — CEFAZOLIN SODIUM-DEXTROSE 2-3 GM-% IV SOLR
2.0000 g | INTRAVENOUS | Status: AC
  Administered 2014-07-19: 2 g via INTRAVENOUS

## 2014-07-19 MED ORDER — FENTANYL CITRATE 0.05 MG/ML IJ SOLN: 50.0000 ug | INTRAMUSCULAR | Status: DC | PRN

## 2014-07-19 MED ORDER — HYDROCODONE-ACETAMINOPHEN 5-325 MG PO TABS: 1.0000 | ORAL_TABLET | Freq: Four times a day (QID) | ORAL | Status: DC | PRN

## 2014-07-19 MED ORDER — HYDROMORPHONE HCL 1 MG/ML IJ SOLN
0.2500 mg | INTRAMUSCULAR | Status: DC | PRN
  Administered 2014-07-19: 0.5 mg via INTRAVENOUS
  Administered 2014-07-19 (×2): 0.25 mg via INTRAVENOUS

## 2014-07-19 MED ORDER — CEFAZOLIN SODIUM-DEXTROSE 2-3 GM-% IV SOLR
INTRAVENOUS | Status: AC
  Filled 2014-07-19: qty 50

## 2014-07-19 MED ORDER — OXYCODONE HCL 5 MG/5ML PO SOLN: 5.0000 mg | Freq: Once | ORAL | Status: DC | PRN

## 2014-07-19 MED ORDER — PROMETHAZINE HCL 25 MG/ML IJ SOLN
6.2500 mg | INTRAMUSCULAR | Status: DC | PRN
  Administered 2014-07-19: 6.25 mg via INTRAVENOUS

## 2014-07-19 SURGICAL SUPPLY — 39 items
BANDAGE ELASTIC 3 VELCRO ST LF (GAUZE/BANDAGES/DRESSINGS) ×3 IMPLANT
BLADE MINI RND TIP GREEN BEAV (BLADE) ×3 IMPLANT
BLADE SURG 15 STRL LF DISP TIS (BLADE) ×1 IMPLANT
BLADE SURG 15 STRL SS (BLADE) ×3
BNDG CMPR 9X4 STRL LF SNTH (GAUZE/BANDAGES/DRESSINGS) ×1
BNDG ESMARK 4X9 LF (GAUZE/BANDAGES/DRESSINGS) ×3 IMPLANT
BRUSH SCRUB EZ PLAIN DRY (MISCELLANEOUS) ×3 IMPLANT
CORDS BIPOLAR (ELECTRODE) ×3 IMPLANT
COVER BACK TABLE 60X90IN (DRAPES) ×3 IMPLANT
COVER MAYO STAND STRL (DRAPES) ×3 IMPLANT
CUFF TOURNIQUET SINGLE 18IN (TOURNIQUET CUFF) ×2 IMPLANT
DECANTER SPIKE VIAL GLASS SM (MISCELLANEOUS) IMPLANT
DRAPE EXTREMITY T 121X128X90 (DRAPE) ×3 IMPLANT
DRAPE SURG 17X23 STRL (DRAPES) ×3 IMPLANT
GAUZE SPONGE 4X4 12PLY STRL (GAUZE/BANDAGES/DRESSINGS) ×3 IMPLANT
GAUZE SPONGE 4X4 16PLY XRAY LF (GAUZE/BANDAGES/DRESSINGS) IMPLANT
GAUZE XEROFORM 1X8 LF (GAUZE/BANDAGES/DRESSINGS) ×3 IMPLANT
GLOVE BIO SURGEON STRL SZ 6.5 (GLOVE) ×1 IMPLANT
GLOVE BIO SURGEONS STRL SZ 6.5 (GLOVE) ×1
GLOVE BIOGEL PI IND STRL 7.0 (GLOVE) IMPLANT
GLOVE BIOGEL PI INDICATOR 7.0 (GLOVE) ×4
GLOVE NEODERM STRL 7.5 LF PF (GLOVE) ×1 IMPLANT
GLOVE SURG NEODERM 7.5  LF PF (GLOVE) ×2
GLOVE SURG SYN 7.5  E (GLOVE) ×2
GLOVE SURG SYN 7.5 E (GLOVE) ×1 IMPLANT
GLOVE SURG SYN 7.5 PF PI (GLOVE) ×1 IMPLANT
GOWN PREVENTION PLUS XLARGE (GOWN DISPOSABLE) ×3 IMPLANT
GOWN STRL REIN XL XLG (GOWN DISPOSABLE) ×3 IMPLANT
NEEDLE HYPO 22GX1.5 SAFETY (NEEDLE) ×2 IMPLANT
NS IRRIG 1000ML POUR BTL (IV SOLUTION) ×3 IMPLANT
PACK BASIN DAY SURGERY FS (CUSTOM PROCEDURE TRAY) ×3 IMPLANT
SPLINT UNIVERSAL RIGHT (SOFTGOODS) ×2 IMPLANT
STOCKINETTE 4X48 STRL (DRAPES) ×3 IMPLANT
SUT ETHILON 4 0 PS 2 18 (SUTURE) ×3 IMPLANT
SYR BULB 3OZ (MISCELLANEOUS) ×3 IMPLANT
SYR CONTROL 10ML LL (SYRINGE) ×2 IMPLANT
TOWEL OR 17X24 6PK STRL BLUE (TOWEL DISPOSABLE) ×3 IMPLANT
TRAY DSU PREP LF (CUSTOM PROCEDURE TRAY) ×3 IMPLANT
UNDERPAD 30X30 INCONTINENT (UNDERPADS AND DIAPERS) ×3 IMPLANT

## 2014-07-19 NOTE — H&P (Signed)
PREOPERATIVE H&P  Chief Complaint: Right carpal tunnel syndrome, right wrist tenosynovitis  HPI: Sonya [name] is a 74 y.o. female who presents for surgical treatment of Right carpal tunnel syndrome, right wrist tenosynovitis.  She denies any changes in medical history.  Past Medical History  Diagnosis Date  . GERD (gastroesophageal reflux disease)   . IBS (irritable bowel syndrome)   . Chronic diarrhea   . Arthritis   . Cancer     lt mastectomy-breast   Past Surgical History  Procedure Laterality Date  . Mastectomy  1990    lt mast  . Upper gi endoscopy    . Colonoscopy    . Tonsillectomy    . Carpal tunnel release  2015    left  . Melanoma excision      leg   History   Social History  . Marital Status: Married    Spouse Name: N/A  . Number of Children: N/A  . Years of Education: N/A   Social History Main Topics  . Smoking status: Former Smoker    Quit date: 07/17/1986  . Smokeless tobacco: Not on file  . Alcohol Use: Yes     Comment: occ  . Drug Use: No  . Sexual Activity: Not on file   Other Topics Concern  . None   Social History Narrative  . None   History reviewed. No pertinent family history. Allergies  Allergen Reactions  . Celebrex [Celecoxib] Rash   Prior to Admission medications   Medication Sig Start Date End Date Taking? Authorizing Provider  loperamide (IMODIUM) 2 MG capsule Take by mouth as needed for diarrhea or loose stools.   Yes Historical Provider, MD  pantoprazole (PROTONIX) 40 MG tablet Take 40 mg by mouth daily.   Yes Historical Provider, MD  raloxifene (EVISTA) 60 MG tablet Take 60 mg by mouth daily.   Yes Historical Provider, MD     Positive ROS: All other systems have been reviewed and were otherwise negative with the exception of those mentioned in the HPI and as above.  Physical Exam: General: Alert, no acute distress Cardiovascular: No pedal edema Respiratory: No cyanosis, no use of accessory musculature GI:  abdomen soft Skin: No lesions in the area of chief complaint Neurologic: Sensation intact distally Psychiatric: Patient is competent for consent with normal mood and affect Lymphatic: no lymphedema  MUSCULOSKELETAL: exam stable  Assessment: Right carpal tunnel syndrome, right wrist tenosynovitis  Plan: Plan for Procedure(s): RIGHT CARPAL TUNNEL RELEASE AND TENOSYNOVECTOMY TENOSYNOVECTOMY  The risks benefits and alternatives were discussed with the patient including but not limited to the risks of nonoperative treatment, versus surgical intervention including infection, bleeding, nerve injury,  blood clots, cardiopulmonary complications, morbidity, mortality, among others, and they were willing to proceed.   Marianna Payment, MD   07/19/2014 6:57 AM

## 2014-07-19 NOTE — Discharge Instructions (Signed)
Postoperative instructions:  Weightbearing: As tolerated.  No heavy lifting for 4 weeks  Keep your dressing and/or splint clean and dry at all times.  You can remove your dressing on post-operative day #3 and change with a dry/sterile dressing or Band-Aids as needed thereafter.  Wear wrist splint at all times  Incision instructions:  Do not soak your incision for 3 weeks after surgery.  If the incision gets wet, pat dry and do not scrub the incision.  Pain control:  You have been given a prescription to be taken as directed for post-operative pain control.  In addition, elevate the operative extremity above the heart at all times to prevent swelling and throbbing pain.  Take over-the-counter Colace, 100mg  by mouth twice a day while taking narcotic pain medications to help prevent constipation.  Follow up appointments: 1) 10-14 days for suture removal and wound check. 2) Dr. Erlinda Hong as scheduled.   -------------------------------------------------------------------------------------------------------------  After Surgery Pain Control:  After your surgery, post-surgical discomfort or pain is likely. This discomfort can last several days to a few weeks. At certain times of the day your discomfort may be more intense.  Did you receive a nerve block?  A nerve block can provide pain relief for one hour to two days after your surgery. As long as the nerve block is working, you will experience little or no sensation in the area the surgeon operated on.  As the nerve block wears off, you will begin to experience pain or discomfort. It is very important that you begin taking your prescribed pain medication before the nerve block fully wears off. Treating your pain at the first sign of the block wearing off will ensure your pain is better controlled and more tolerable when full-sensation returns. Do not wait until the pain is intolerable, as the medicine will be less effective. It is better to treat pain in  advance than to try and catch up.  General Anesthesia:  If you did not receive a nerve block during your surgery, you will need to start taking your pain medication shortly after your surgery and should continue to do so as prescribed by your surgeon.  Pain Medication:  Most commonly we prescribe Vicodin and Percocet for post-operative pain. Both of these medications contain a combination of acetaminophen (Tylenol) and a narcotic to help control pain.   It takes between 30 and 45 minutes before pain medication starts to work. It is important to take your medication before your pain level gets too intense.   Nausea is a common side effect of many pain medications. You will want to eat something before taking your pain medicine to help prevent nausea.   If you are taking a prescription pain medication that contains acetaminophen, we recommend that you do not take additional over the counter acetaminophen (Tylenol).  Other pain relieving options:   Using a cold pack to ice the affected area a few times a day (15 to 20 minutes at a time) can help to relieve pain, reduce swelling and bruising.   Elevation of the affected area can also help to reduce pain and swelling.     Post Anesthesia Home Care Instructions  Activity: Get plenty of rest for the remainder of the day. A responsible adult should stay with you for 24 hours following the procedure.  For the next 24 hours, DO NOT: -Drive a car -Paediatric nurse -Drink alcoholic beverages -Take any medication unless instructed by your physician -Make any legal decisions or sign important  papers.  Meals: Start with liquid foods such as gelatin or soup. Progress to regular foods as tolerated. Avoid greasy, spicy, heavy foods. If nausea and/or vomiting occur, drink only clear liquids until the nausea and/or vomiting subsides. Call your physician if vomiting continues.  Special Instructions/Symptoms: Your throat may feel dry or sore from the  anesthesia or the breathing tube placed in your throat during surgery. If this causes discomfort, gargle with warm salt water. The discomfort should disappear within 24 hours.

## 2014-07-19 NOTE — Transfer of Care (Signed)
Immediate Anesthesia Transfer of Care Note  Patient: Sonya [name]  Procedure(s) Performed: Procedure(s): RIGHT CARPAL TUNNEL RELEASE AND TENOSYNOVECTOMY (Right) TENOSYNOVECTOMY (Right)  Patient Location: PACU  Anesthesia Type:General  Level of Consciousness: awake, alert , oriented and patient cooperative  Airway & Oxygen Therapy: Patient Spontanous Breathing and Patient connected to face mask oxygen  Post-op Assessment: Report given to RN and Post -op Vital signs reviewed and stable  Post vital signs: Reviewed and stable  Last Vitals:  Filed Vitals:   07/19/14 0931  BP: 161/87  Pulse: 73  Temp: 36.7 C  Resp: 20    Complications: No apparent anesthesia complications

## 2014-07-19 NOTE — Anesthesia Preprocedure Evaluation (Addendum)
Anesthesia Evaluation  Patient identified by MRN, date of birth, ID band Patient awake    Reviewed: Allergy & Precautions, NPO status   Airway Mallampati: II  TM Distance: >3 FB Neck ROM: full    Dental  (+) Teeth Intact, Dental Advidsory Given   Pulmonary former smoker,    Pulmonary exam normal       Cardiovascular negative cardio ROS      Neuro/Psych    GI/Hepatic Neg liver ROS, GERD-  Medicated,IBS   Endo/Other  negative endocrine ROS  Renal/GU negative Renal ROS     Musculoskeletal  (+) Arthritis -,   Abdominal Normal abdominal exam  (+)   Peds  Hematology negative hematology ROS (+)   Anesthesia Other Findings   Reproductive/Obstetrics                           Anesthesia Physical Anesthesia Plan  ASA: III  Anesthesia Plan: General LMA   Post-op Pain Management:    Induction:   Airway Management Planned:   Additional Equipment:   Intra-op Plan:   Post-operative Plan:   Informed Consent: I have reviewed the patients History and Physical, chart, labs and discussed the procedure including the risks, benefits and alternatives for the proposed anesthesia with the patient or authorized representative who has indicated his/her understanding and acceptance.   Dental Advisory Given  Plan Discussed with: Anesthesiologist, CRNA and Surgeon  Anesthesia Plan Comments:        Anesthesia Quick Evaluation

## 2014-07-19 NOTE — Op Note (Signed)
Date of surgery: 07/19/2014  Preoperative diagnosis: 1. Right carpal tunnel syndrome 2. Right wrist extensor Tenosynovitis  Postoperative diagnosis: Same  Procedure: 1. Right open carpal tunnel release 2. Tenosynovectomy of right wrist extensor tendons x 4  Surgeon: Eduard Roux, M.D.  Anesthesia: Gen.  Estimated blood loss: 25 mL  Complications: None  Tourniquet time: Less than 30 minutes  Indications for procedure: The patient is a 74 year old female who presents today for surgical treatment of the above-mentioned conditions after failing conservative treatment. She was aware of the risks, benefits, and alternatives to surgery and she wished to proceed.  Description of procedure: The patient was identified in the preoperative holding area. The operative sites were marked on the surgeon confirmed with the patient. She is brought back to the operating room. She was placed supine on the table. Nonsterile tourniquet was placed on the upper right arm. Right upper extremity was prepped and draped in standard sterile fashion. Timeout was performed preoperative antibiotic for given. We first began with the carpal tunnel release. A longitudinal incision approximately 5 mm ulnar to the thenar crease in line with the radial border of the ring finger was used. Blunt dissection was taken down to the level of the palmar fascia. The palmar fascia was sharply incised in line with the incision. The distal edge of the transverse carpal ligament was identified by the sentinel fat. A mosquito was used to bluntly penetrate just deep to the transverse carpal ligament. Under direct visualization a Beaver blade was used to sharply incise the transverse carpal ligament from a  Distal to proximal fashion. A subcutaneous tunnel was then created for placement of a sore retractor. The rest of the transverse carpal ligament and the antebrachial fascia was released under direct visualization. We then released the last few  remaining fibers distally under direct visualization. Care was taken not to violate the superficial palmar arch. We then turned our attention to the tenosynovectomy of the right wrist tendons. A longitudinal incision over the fourth also wrist compartment was used. Blunt dissection was taken down to the level of the extensor retinaculum. The extensor retinaculum was sharply incised. The third compartment was not violated. There was extensive amount of tenosynovitis in the fourth compartment. This was all sharply excised. Care was taken not to injure the extensor tendons. Once we felt we had adequate debridement the tourniquet was deflated. Hemostasis was obtained. The wounds were thoroughly irrigated with normal saline. The carpal tunnel incision was closed with interrupted 4-0 nylon sutures. The dorsal wrist incision was closed in layer fashion using 0 Ethibond for the retinaculum and 4-0 nylon sutures for the skin. Sterile dressings were applied. The wrist was immobilized in a Velcro wrist splint. Patient was extubated and transferred to the PACU in stable condition.  Postoperative plan: The patient will be weight bear as tolerated to the right upper extremity. She is to avoid heavy lifting for 4 weeks. We will plan on putting her in hand therapy at her first postoperative visit.  Azucena Cecil, MD [place] 10:51 AM

## 2014-07-19 NOTE — Anesthesia Postprocedure Evaluation (Signed)
Anesthesia Post Note  Patient: Sonya [name]  Procedure(s) Performed: Procedure(s) (LRB): RIGHT CARPAL TUNNEL RELEASE AND TENOSYNOVECTOMY (Right) TENOSYNOVECTOMY (Right)  Anesthesia type: general  Patient location: PACU  Post pain: Pain level controlled  Post assessment: Patient's Cardiovascular Status Stable  Last Vitals:  Filed Vitals:   07/19/14 1130  BP: 155/77  Pulse: 83  Temp:   Resp: 10    Post vital signs: Reviewed and stable  Level of consciousness: sedated  Complications: No apparent anesthesia complications

## 2014-07-19 NOTE — Anesthesia Procedure Notes (Signed)
Procedure Name: LMA Insertion Date/Time: 07/19/2014 9:43 AM Performed by: Tawona Filsinger D Pre-anesthesia Checklist: Patient identified, Emergency Drugs available, Suction available and Patient being monitored Patient Re-evaluated:Patient Re-evaluated prior to inductionOxygen Delivery Method: Circle System Utilized Preoxygenation: Pre-oxygenation with 100% oxygen Intubation Type: IV induction Ventilation: Mask ventilation without difficulty LMA: LMA inserted LMA Size: 4.0 Number of attempts: 1 Airway Equipment and Method: Bite block Placement Confirmation: positive ETCO2 Tube secured with: Tape Dental Injury: Teeth and Oropharynx as per pre-operative assessment

## 2014-07-20 ENCOUNTER — Encounter (HOSPITAL_BASED_OUTPATIENT_CLINIC_OR_DEPARTMENT_OTHER): Payer: Self-pay | Admitting: Orthopaedic Surgery

## 2014-08-09 DIAGNOSIS — M25641 Stiffness of right hand, not elsewhere classified: Secondary | ICD-10-CM | POA: Diagnosis not present

## 2014-08-09 DIAGNOSIS — M62541 Muscle wasting and atrophy, not elsewhere classified, right hand: Secondary | ICD-10-CM | POA: Diagnosis not present

## 2014-08-09 DIAGNOSIS — M150 Primary generalized (osteo)arthritis: Secondary | ICD-10-CM | POA: Diagnosis not present

## 2014-08-09 DIAGNOSIS — M79641 Pain in right hand: Secondary | ICD-10-CM | POA: Diagnosis not present

## 2014-08-10 DIAGNOSIS — M62541 Muscle wasting and atrophy, not elsewhere classified, right hand: Secondary | ICD-10-CM | POA: Diagnosis not present

## 2014-08-10 DIAGNOSIS — M25641 Stiffness of right hand, not elsewhere classified: Secondary | ICD-10-CM | POA: Diagnosis not present

## 2014-08-10 DIAGNOSIS — M79641 Pain in right hand: Secondary | ICD-10-CM | POA: Diagnosis not present

## 2014-08-10 DIAGNOSIS — M150 Primary generalized (osteo)arthritis: Secondary | ICD-10-CM | POA: Diagnosis not present

## 2014-08-14 DIAGNOSIS — M25641 Stiffness of right hand, not elsewhere classified: Secondary | ICD-10-CM | POA: Diagnosis not present

## 2014-08-14 DIAGNOSIS — M79641 Pain in right hand: Secondary | ICD-10-CM | POA: Diagnosis not present

## 2014-08-14 DIAGNOSIS — M62541 Muscle wasting and atrophy, not elsewhere classified, right hand: Secondary | ICD-10-CM | POA: Diagnosis not present

## 2014-08-14 DIAGNOSIS — M150 Primary generalized (osteo)arthritis: Secondary | ICD-10-CM | POA: Diagnosis not present

## 2014-08-16 DIAGNOSIS — M25641 Stiffness of right hand, not elsewhere classified: Secondary | ICD-10-CM | POA: Diagnosis not present

## 2014-08-16 DIAGNOSIS — M150 Primary generalized (osteo)arthritis: Secondary | ICD-10-CM | POA: Diagnosis not present

## 2014-08-16 DIAGNOSIS — M79641 Pain in right hand: Secondary | ICD-10-CM | POA: Diagnosis not present

## 2014-08-16 DIAGNOSIS — M62541 Muscle wasting and atrophy, not elsewhere classified, right hand: Secondary | ICD-10-CM | POA: Diagnosis not present

## 2014-08-17 DIAGNOSIS — M62541 Muscle wasting and atrophy, not elsewhere classified, right hand: Secondary | ICD-10-CM | POA: Diagnosis not present

## 2014-08-17 DIAGNOSIS — M25641 Stiffness of right hand, not elsewhere classified: Secondary | ICD-10-CM | POA: Diagnosis not present

## 2014-08-17 DIAGNOSIS — M150 Primary generalized (osteo)arthritis: Secondary | ICD-10-CM | POA: Diagnosis not present

## 2014-08-17 DIAGNOSIS — M79641 Pain in right hand: Secondary | ICD-10-CM | POA: Diagnosis not present

## 2014-08-21 DIAGNOSIS — M79641 Pain in right hand: Secondary | ICD-10-CM | POA: Diagnosis not present

## 2014-08-21 DIAGNOSIS — M150 Primary generalized (osteo)arthritis: Secondary | ICD-10-CM | POA: Diagnosis not present

## 2014-08-21 DIAGNOSIS — M62541 Muscle wasting and atrophy, not elsewhere classified, right hand: Secondary | ICD-10-CM | POA: Diagnosis not present

## 2014-08-21 DIAGNOSIS — M25641 Stiffness of right hand, not elsewhere classified: Secondary | ICD-10-CM | POA: Diagnosis not present

## 2014-08-23 DIAGNOSIS — M62541 Muscle wasting and atrophy, not elsewhere classified, right hand: Secondary | ICD-10-CM | POA: Diagnosis not present

## 2014-08-23 DIAGNOSIS — M79641 Pain in right hand: Secondary | ICD-10-CM | POA: Diagnosis not present

## 2014-08-23 DIAGNOSIS — M150 Primary generalized (osteo)arthritis: Secondary | ICD-10-CM | POA: Diagnosis not present

## 2014-08-23 DIAGNOSIS — M25641 Stiffness of right hand, not elsewhere classified: Secondary | ICD-10-CM | POA: Diagnosis not present

## 2014-09-06 DIAGNOSIS — M150 Primary generalized (osteo)arthritis: Secondary | ICD-10-CM | POA: Diagnosis not present

## 2014-09-06 DIAGNOSIS — M62541 Muscle wasting and atrophy, not elsewhere classified, right hand: Secondary | ICD-10-CM | POA: Diagnosis not present

## 2014-09-06 DIAGNOSIS — M25641 Stiffness of right hand, not elsewhere classified: Secondary | ICD-10-CM | POA: Diagnosis not present

## 2014-09-06 DIAGNOSIS — M79641 Pain in right hand: Secondary | ICD-10-CM | POA: Diagnosis not present

## 2014-09-11 DIAGNOSIS — M25641 Stiffness of right hand, not elsewhere classified: Secondary | ICD-10-CM | POA: Diagnosis not present

## 2014-09-11 DIAGNOSIS — M150 Primary generalized (osteo)arthritis: Secondary | ICD-10-CM | POA: Diagnosis not present

## 2014-09-11 DIAGNOSIS — M79641 Pain in right hand: Secondary | ICD-10-CM | POA: Diagnosis not present

## 2014-09-11 DIAGNOSIS — M62541 Muscle wasting and atrophy, not elsewhere classified, right hand: Secondary | ICD-10-CM | POA: Diagnosis not present

## 2014-09-12 DIAGNOSIS — M62541 Muscle wasting and atrophy, not elsewhere classified, right hand: Secondary | ICD-10-CM | POA: Diagnosis not present

## 2014-09-12 DIAGNOSIS — M150 Primary generalized (osteo)arthritis: Secondary | ICD-10-CM | POA: Diagnosis not present

## 2014-09-12 DIAGNOSIS — M25641 Stiffness of right hand, not elsewhere classified: Secondary | ICD-10-CM | POA: Diagnosis not present

## 2014-09-12 DIAGNOSIS — M79641 Pain in right hand: Secondary | ICD-10-CM | POA: Diagnosis not present

## 2014-09-20 DIAGNOSIS — M62541 Muscle wasting and atrophy, not elsewhere classified, right hand: Secondary | ICD-10-CM | POA: Diagnosis not present

## 2014-09-20 DIAGNOSIS — M79641 Pain in right hand: Secondary | ICD-10-CM | POA: Diagnosis not present

## 2014-09-20 DIAGNOSIS — M25641 Stiffness of right hand, not elsewhere classified: Secondary | ICD-10-CM | POA: Diagnosis not present

## 2014-09-20 DIAGNOSIS — M150 Primary generalized (osteo)arthritis: Secondary | ICD-10-CM | POA: Diagnosis not present

## 2014-10-02 DIAGNOSIS — M79641 Pain in right hand: Secondary | ICD-10-CM | POA: Diagnosis not present

## 2014-10-02 DIAGNOSIS — M25641 Stiffness of right hand, not elsewhere classified: Secondary | ICD-10-CM | POA: Diagnosis not present

## 2014-10-02 DIAGNOSIS — M62541 Muscle wasting and atrophy, not elsewhere classified, right hand: Secondary | ICD-10-CM | POA: Diagnosis not present

## 2014-10-02 DIAGNOSIS — M150 Primary generalized (osteo)arthritis: Secondary | ICD-10-CM | POA: Diagnosis not present

## 2014-10-04 DIAGNOSIS — M25641 Stiffness of right hand, not elsewhere classified: Secondary | ICD-10-CM | POA: Diagnosis not present

## 2014-10-04 DIAGNOSIS — M62541 Muscle wasting and atrophy, not elsewhere classified, right hand: Secondary | ICD-10-CM | POA: Diagnosis not present

## 2014-10-04 DIAGNOSIS — M79641 Pain in right hand: Secondary | ICD-10-CM | POA: Diagnosis not present

## 2014-10-04 DIAGNOSIS — M150 Primary generalized (osteo)arthritis: Secondary | ICD-10-CM | POA: Diagnosis not present

## 2014-10-05 ENCOUNTER — Encounter (INDEPENDENT_AMBULATORY_CARE_PROVIDER_SITE_OTHER): Payer: Self-pay | Admitting: Internal Medicine

## 2014-10-05 ENCOUNTER — Ambulatory Visit (INDEPENDENT_AMBULATORY_CARE_PROVIDER_SITE_OTHER): Payer: Medicare Other | Admitting: Internal Medicine

## 2014-10-05 VITALS — BP 110/50 | HR 64 | Temp 98.1°F | Ht 64.0 in | Wt 149.8 lb

## 2014-10-05 DIAGNOSIS — M79641 Pain in right hand: Secondary | ICD-10-CM | POA: Diagnosis not present

## 2014-10-05 DIAGNOSIS — K589 Irritable bowel syndrome without diarrhea: Secondary | ICD-10-CM

## 2014-10-05 DIAGNOSIS — M62541 Muscle wasting and atrophy, not elsewhere classified, right hand: Secondary | ICD-10-CM | POA: Diagnosis not present

## 2014-10-05 DIAGNOSIS — M25641 Stiffness of right hand, not elsewhere classified: Secondary | ICD-10-CM | POA: Diagnosis not present

## 2014-10-05 DIAGNOSIS — M150 Primary generalized (osteo)arthritis: Secondary | ICD-10-CM | POA: Diagnosis not present

## 2014-10-05 MED ORDER — DICYCLOMINE HCL 10 MG PO CAPS: 10.0000 mg | ORAL_CAPSULE | Freq: Two times a day (BID) | ORAL | Status: DC

## 2014-10-05 NOTE — Progress Notes (Signed)
Subjective:    Patient ID: Sonya [name], female    DOB: 1941-05-05, 74 y.o.   MRN: [id number]  HPI Here today for f/u. She was last seen in January of 2015. She tells me she is having explosive BMs. Stools are formed and soft, but explosive. Very unpredictable.  She may at times have 8-10 stools a day.  She takes Lomotil occasionally when she goes out, especially when she is playing golf. Her appetite has remained good. No weight loss. No melena or BRRB.  Her last colonoscopy was in 2012 and was normal. There is a family hx of colon cancer.     07/25/2010  Colonoscopy.  INDICATIONS:  Sonya [name] is a 74 year old Caucasian female who is here for high-risk screening colonoscopy.  Last colonoscopy was about 5 years ago with removal of 2 cecal polyps.  They were non adenomatous.  Her family history is positive for colon carcinoma in her mother at age 81, maternal grandmother had died of colon carcinoma at age 64 and maternal grandfather also had colon carcinoma and it was in his 28s. FINAL DIAGNOSES: 1. Examination performed to cecum. 2. Few scattered diverticula throughout the colon. 3. Small external hemorrhoids.   2007 Celiac panel: negative. CBC    Component Value Date/Time   WBC 6.6 05/13/2013 1021   RBC 4.22 05/13/2013 1021   HGB 14.1 05/13/2013 1021   HCT 41.2 05/13/2013 1021   PLT 245 05/13/2013 1021   MCV 97.6 05/13/2013 1021   MCH 33.4 05/13/2013 1021   MCHC 34.2 05/13/2013 1021   RDW 13.5 05/13/2013 1021   LYMPHSABS 1.7 05/13/2013 1021   MONOABS 0.4 05/13/2013 1021   EOSABS 0.1 05/13/2013 1021   BASOSABS 0.0 05/13/2013 1021      Review of Systems Past Medical History  Diagnosis Date  . Cancer     Left Breast  . Breast cancer     Past Surgical History  Procedure Laterality Date  . Tonsillectomy    . Abdominal hysterectomy    . Breast surgery    . Carpal tunnel release Left 05/20/2013    Procedure: CARPAL TUNNEL RELEASE;  Surgeon: Carole Civil, MD;   Location: [facility];  Service: Orthopedics;  Laterality: Left;    Allergies  Allergen Reactions  . Celebrex [Celecoxib]     REACTION: rash, sob, edema, reddness    Current Outpatient Prescriptions on File Prior to Visit  Medication Sig Dispense Refill  . diclofenac (CATAFLAM) 50 MG tablet Take 1 tablet (50 mg total) by mouth 2 (two) times daily. 60 tablet 2  . pantoprazole (PROTONIX) 40 MG tablet Take 1 tablet (40 mg total) by mouth daily. 90 tablet 3  . raloxifene (EVISTA) 60 MG tablet Take 60 mg by mouth daily.     No current facility-administered medications on file prior to visit.        Objective:   Physical Exam Blood pressure 110/50, pulse 64, temperature 98.1 F (36.7 C), height 5\' 4"  (1.626 m), weight 149 lb 12.8 oz (67.949 kg).  Alert and oriented. Skin warm and dry. Oral mucosa is moist.   . Sclera anicteric, conjunctivae is pink. Thyroid not enlarged. No cervical lymphadenopathy. Lungs clear. Heart regular rate and rhythm.  Abdomen is soft. Bowel sounds are positive. No hepatomegaly. No abdominal masses felt. No tenderness.  No edema to lower extremities.         Assessment & Plan:  IBS. Long hx of same that has been hard to control. I am  going to give her samples of IBGARD and call in an Rx for Dicyclomine BID and see how she does. She will call with a PR in 2-3 weeks.

## 2014-10-05 NOTE — Patient Instructions (Addendum)
IBGARD samples x 7. OV as needed. PR in 2-3 weeks.

## 2014-10-06 DIAGNOSIS — L570 Actinic keratosis: Secondary | ICD-10-CM | POA: Diagnosis not present

## 2014-10-06 DIAGNOSIS — R350 Frequency of micturition: Secondary | ICD-10-CM | POA: Diagnosis not present

## 2014-10-06 DIAGNOSIS — R140 Abdominal distension (gaseous): Secondary | ICD-10-CM | POA: Diagnosis not present

## 2014-10-06 DIAGNOSIS — Z85828 Personal history of other malignant neoplasm of skin: Secondary | ICD-10-CM | POA: Diagnosis not present

## 2014-10-06 DIAGNOSIS — L814 Other melanin hyperpigmentation: Secondary | ICD-10-CM | POA: Diagnosis not present

## 2014-10-06 DIAGNOSIS — D485 Neoplasm of uncertain behavior of skin: Secondary | ICD-10-CM | POA: Diagnosis not present

## 2014-10-06 DIAGNOSIS — L821 Other seborrheic keratosis: Secondary | ICD-10-CM | POA: Diagnosis not present

## 2014-10-10 DIAGNOSIS — M62541 Muscle wasting and atrophy, not elsewhere classified, right hand: Secondary | ICD-10-CM | POA: Diagnosis not present

## 2014-10-10 DIAGNOSIS — M150 Primary generalized (osteo)arthritis: Secondary | ICD-10-CM | POA: Diagnosis not present

## 2014-10-10 DIAGNOSIS — M25641 Stiffness of right hand, not elsewhere classified: Secondary | ICD-10-CM | POA: Diagnosis not present

## 2014-10-10 DIAGNOSIS — M79641 Pain in right hand: Secondary | ICD-10-CM | POA: Diagnosis not present

## 2014-10-11 DIAGNOSIS — R141 Gas pain: Secondary | ICD-10-CM | POA: Diagnosis not present

## 2014-10-11 DIAGNOSIS — R1907 Generalized intra-abdominal and pelvic swelling, mass and lump: Secondary | ICD-10-CM | POA: Diagnosis not present

## 2014-10-12 DIAGNOSIS — M150 Primary generalized (osteo)arthritis: Secondary | ICD-10-CM | POA: Diagnosis not present

## 2014-10-12 DIAGNOSIS — M62541 Muscle wasting and atrophy, not elsewhere classified, right hand: Secondary | ICD-10-CM | POA: Diagnosis not present

## 2014-10-12 DIAGNOSIS — M25641 Stiffness of right hand, not elsewhere classified: Secondary | ICD-10-CM | POA: Diagnosis not present

## 2014-10-12 DIAGNOSIS — M79641 Pain in right hand: Secondary | ICD-10-CM | POA: Diagnosis not present

## 2014-10-23 ENCOUNTER — Other Ambulatory Visit (INDEPENDENT_AMBULATORY_CARE_PROVIDER_SITE_OTHER): Payer: Self-pay | Admitting: Internal Medicine

## 2014-12-22 ENCOUNTER — Encounter (INDEPENDENT_AMBULATORY_CARE_PROVIDER_SITE_OTHER): Payer: Self-pay | Admitting: Internal Medicine

## 2015-01-10 ENCOUNTER — Telehealth (INDEPENDENT_AMBULATORY_CARE_PROVIDER_SITE_OTHER): Payer: Self-pay | Admitting: Internal Medicine

## 2015-01-10 ENCOUNTER — Ambulatory Visit (INDEPENDENT_AMBULATORY_CARE_PROVIDER_SITE_OTHER): Payer: Medicare Other | Admitting: Internal Medicine

## 2015-01-10 NOTE — Telephone Encounter (Signed)
Patient stopped by office to tell me she stopped the Protonix. She was having side effects. She says she is sleeping better. She is not having any acid reflux.  Her legs are not hurting. She is having BMs x 2 a day. Occasionally she will have some abdominal cramps.  She is taking Zantac at night.  She is also taking Tums twice a day and feel better.

## 2015-02-15 ENCOUNTER — Other Ambulatory Visit (HOSPITAL_COMMUNITY): Payer: Self-pay | Admitting: Internal Medicine

## 2015-02-15 DIAGNOSIS — Z1231 Encounter for screening mammogram for malignant neoplasm of breast: Secondary | ICD-10-CM

## 2015-02-22 ENCOUNTER — Ambulatory Visit (HOSPITAL_COMMUNITY)
Admission: RE | Admit: 2015-02-22 | Discharge: 2015-02-22 | Disposition: A | Discharge status: Home / Self Care | Payer: Medicare Other | Source: Ambulatory Visit | Attending: Internal Medicine | Admitting: Internal Medicine

## 2015-02-22 DIAGNOSIS — Z1231 Encounter for screening mammogram for malignant neoplasm of breast: Secondary | ICD-10-CM

## 2015-03-20 DIAGNOSIS — H25013 Cortical age-related cataract, bilateral: Secondary | ICD-10-CM | POA: Diagnosis not present

## 2015-03-20 DIAGNOSIS — H2513 Age-related nuclear cataract, bilateral: Secondary | ICD-10-CM | POA: Diagnosis not present

## 2015-04-18 DIAGNOSIS — Z23 Encounter for immunization: Secondary | ICD-10-CM | POA: Diagnosis not present

## 2015-04-18 DIAGNOSIS — Z6825 Body mass index (BMI) 25.0-25.9, adult: Secondary | ICD-10-CM | POA: Diagnosis not present

## 2015-04-18 DIAGNOSIS — M2550 Pain in unspecified joint: Secondary | ICD-10-CM | POA: Diagnosis not present

## 2015-04-18 DIAGNOSIS — R21 Rash and other nonspecific skin eruption: Secondary | ICD-10-CM | POA: Diagnosis not present

## 2015-05-25 DIAGNOSIS — Z79899 Other long term (current) drug therapy: Secondary | ICD-10-CM | POA: Diagnosis not present

## 2015-05-25 DIAGNOSIS — C50919 Malignant neoplasm of unspecified site of unspecified female breast: Secondary | ICD-10-CM | POA: Diagnosis not present

## 2015-05-25 DIAGNOSIS — K589 Irritable bowel syndrome without diarrhea: Secondary | ICD-10-CM | POA: Diagnosis not present

## 2015-05-25 DIAGNOSIS — M810 Age-related osteoporosis without current pathological fracture: Secondary | ICD-10-CM | POA: Diagnosis not present

## 2015-06-01 DIAGNOSIS — Z0001 Encounter for general adult medical examination with abnormal findings: Secondary | ICD-10-CM | POA: Diagnosis not present

## 2015-06-01 DIAGNOSIS — M810 Age-related osteoporosis without current pathological fracture: Secondary | ICD-10-CM | POA: Diagnosis not present

## 2015-06-01 DIAGNOSIS — G2581 Restless legs syndrome: Secondary | ICD-10-CM | POA: Diagnosis not present

## 2015-06-01 DIAGNOSIS — Z853 Personal history of malignant neoplasm of breast: Secondary | ICD-10-CM | POA: Diagnosis not present

## 2015-06-12 DIAGNOSIS — D225 Melanocytic nevi of trunk: Secondary | ICD-10-CM | POA: Diagnosis not present

## 2015-06-12 DIAGNOSIS — L853 Xerosis cutis: Secondary | ICD-10-CM | POA: Diagnosis not present

## 2015-06-12 DIAGNOSIS — Z85828 Personal history of other malignant neoplasm of skin: Secondary | ICD-10-CM | POA: Diagnosis not present

## 2015-06-12 DIAGNOSIS — L570 Actinic keratosis: Secondary | ICD-10-CM | POA: Diagnosis not present

## 2015-06-12 DIAGNOSIS — L821 Other seborrheic keratosis: Secondary | ICD-10-CM | POA: Diagnosis not present

## 2015-07-26 ENCOUNTER — Encounter (INDEPENDENT_AMBULATORY_CARE_PROVIDER_SITE_OTHER): Payer: Self-pay | Admitting: *Deleted

## 2015-09-12 ENCOUNTER — Other Ambulatory Visit (INDEPENDENT_AMBULATORY_CARE_PROVIDER_SITE_OTHER): Payer: Self-pay | Admitting: *Deleted

## 2015-09-12 DIAGNOSIS — Z86010 Personal history of colonic polyps: Secondary | ICD-10-CM

## 2015-09-12 DIAGNOSIS — Z800 Family history of malignant neoplasm of digestive organs: Secondary | ICD-10-CM

## 2015-10-18 ENCOUNTER — Encounter (INDEPENDENT_AMBULATORY_CARE_PROVIDER_SITE_OTHER): Payer: Self-pay | Admitting: *Deleted

## 2015-10-18 ENCOUNTER — Other Ambulatory Visit (INDEPENDENT_AMBULATORY_CARE_PROVIDER_SITE_OTHER): Payer: Self-pay | Admitting: *Deleted

## 2015-10-18 NOTE — Telephone Encounter (Signed)
Patient needs trilyte 

## 2015-10-22 ENCOUNTER — Telehealth (INDEPENDENT_AMBULATORY_CARE_PROVIDER_SITE_OTHER): Payer: Self-pay | Admitting: *Deleted

## 2015-10-22 MED ORDER — PEG 3350-KCL-NA BICARB-NACL 420 G PO SOLR: 4000.0000 mL | Freq: Once | ORAL | Status: DC

## 2015-10-22 NOTE — Telephone Encounter (Signed)
Referring MD/PCP: fagan   Procedure: tcs  Reason/Indication:  Hx polyps, fa, hx colon ca  Has patient had this procedure before?  Yes, 2012 -- epic  If so, when, by whom and where?    Is there a family history of colon cancer?  Yes, mother  Who?  What age when diagnosed?    Is patient diabetic?   no      Does patient have prosthetic heart valve or mechanical valve?  no  Do you have a pacemaker?  no  Has patient ever had endocarditis? no  Has patient had joint replacement within last 12 months?  no  Does patient tend to be constipated or take laxatives? no  Does patient have a history of alcohol/drug use?  no  Is patient on Coumadin, Plavix and/or Aspirin? no  Medications: aleve prn, Evista 60 mg daily  Allergies: celebrex  Medication Adjustment:   Procedure date & time: 11/21/15 at 1200

## 2015-10-24 NOTE — Telephone Encounter (Signed)
agree

## 2015-11-09 ENCOUNTER — Other Ambulatory Visit: Payer: Self-pay | Admitting: Internal Medicine

## 2015-11-09 ENCOUNTER — Other Ambulatory Visit (HOSPITAL_COMMUNITY): Payer: Self-pay | Admitting: Internal Medicine

## 2015-11-09 DIAGNOSIS — R109 Unspecified abdominal pain: Secondary | ICD-10-CM | POA: Diagnosis not present

## 2015-11-09 DIAGNOSIS — R1084 Generalized abdominal pain: Secondary | ICD-10-CM

## 2015-11-12 ENCOUNTER — Ambulatory Visit (HOSPITAL_COMMUNITY)
Admission: RE | Admit: 2015-11-12 | Discharge: 2015-11-12 | Disposition: A | Discharge status: Home / Self Care | Payer: Medicare Other | Source: Ambulatory Visit | Attending: Internal Medicine | Admitting: Internal Medicine

## 2015-11-12 DIAGNOSIS — R1084 Generalized abdominal pain: Secondary | ICD-10-CM | POA: Diagnosis not present

## 2015-11-12 DIAGNOSIS — C50919 Malignant neoplasm of unspecified site of unspecified female breast: Secondary | ICD-10-CM | POA: Diagnosis not present

## 2015-11-12 DIAGNOSIS — K7689 Other specified diseases of liver: Secondary | ICD-10-CM | POA: Diagnosis not present

## 2015-11-12 DIAGNOSIS — N281 Cyst of kidney, acquired: Secondary | ICD-10-CM | POA: Insufficient documentation

## 2015-11-21 ENCOUNTER — Encounter (HOSPITAL_COMMUNITY)
Admission: RE | Disposition: A | Discharge status: Home / Self Care | Payer: Self-pay | Source: Ambulatory Visit | Attending: Internal Medicine

## 2015-11-21 ENCOUNTER — Encounter (HOSPITAL_COMMUNITY): Payer: Self-pay | Admitting: *Deleted

## 2015-11-21 ENCOUNTER — Ambulatory Visit (HOSPITAL_COMMUNITY)
Admission: RE | Admit: 2015-11-21 | Discharge: 2015-11-21 | Disposition: A | Discharge status: Home / Self Care | Payer: Medicare Other | Source: Ambulatory Visit | Attending: Internal Medicine | Admitting: Internal Medicine

## 2015-11-21 DIAGNOSIS — Z853 Personal history of malignant neoplasm of breast: Secondary | ICD-10-CM | POA: Insufficient documentation

## 2015-11-21 DIAGNOSIS — K580 Irritable bowel syndrome with diarrhea: Secondary | ICD-10-CM | POA: Insufficient documentation

## 2015-11-21 DIAGNOSIS — Z90710 Acquired absence of both cervix and uterus: Secondary | ICD-10-CM | POA: Insufficient documentation

## 2015-11-21 DIAGNOSIS — Z79899 Other long term (current) drug therapy: Secondary | ICD-10-CM | POA: Insufficient documentation

## 2015-11-21 DIAGNOSIS — K219 Gastro-esophageal reflux disease without esophagitis: Secondary | ICD-10-CM | POA: Insufficient documentation

## 2015-11-21 DIAGNOSIS — Z87891 Personal history of nicotine dependence: Secondary | ICD-10-CM | POA: Insufficient documentation

## 2015-11-21 DIAGNOSIS — Z800 Family history of malignant neoplasm of digestive organs: Secondary | ICD-10-CM | POA: Diagnosis not present

## 2015-11-21 DIAGNOSIS — Z09 Encounter for follow-up examination after completed treatment for conditions other than malignant neoplasm: Secondary | ICD-10-CM | POA: Diagnosis not present

## 2015-11-21 DIAGNOSIS — K644 Residual hemorrhoidal skin tags: Secondary | ICD-10-CM | POA: Insufficient documentation

## 2015-11-21 DIAGNOSIS — K5730 Diverticulosis of large intestine without perforation or abscess without bleeding: Secondary | ICD-10-CM | POA: Insufficient documentation

## 2015-11-21 DIAGNOSIS — Z888 Allergy status to other drugs, medicaments and biological substances status: Secondary | ICD-10-CM | POA: Diagnosis not present

## 2015-11-21 DIAGNOSIS — Z1211 Encounter for screening for malignant neoplasm of colon: Secondary | ICD-10-CM | POA: Diagnosis not present

## 2015-11-21 DIAGNOSIS — M1990 Unspecified osteoarthritis, unspecified site: Secondary | ICD-10-CM | POA: Insufficient documentation

## 2015-11-21 DIAGNOSIS — Z860100 Personal history of colon polyps, unspecified: Secondary | ICD-10-CM

## 2015-11-21 HISTORY — PX: COLONOSCOPY: SHX5424

## 2015-11-21 SURGERY — COLONOSCOPY
Anesthesia: Moderate Sedation

## 2015-11-21 MED ORDER — MIDAZOLAM HCL 5 MG/5ML IJ SOLN
INTRAMUSCULAR | Status: AC
  Filled 2015-11-21: qty 10

## 2015-11-21 MED ORDER — MIDAZOLAM HCL 5 MG/5ML IJ SOLN
INTRAMUSCULAR | Status: DC | PRN
  Administered 2015-11-21 (×2): 2 mg via INTRAVENOUS
  Administered 2015-11-21: 1 mg via INTRAVENOUS
  Administered 2015-11-21: 2 mg via INTRAVENOUS
  Administered 2015-11-21: 1 mg via INTRAVENOUS

## 2015-11-21 MED ORDER — MEPERIDINE HCL 50 MG/ML IJ SOLN
INTRAMUSCULAR | Status: AC
  Filled 2015-11-21: qty 1

## 2015-11-21 MED ORDER — SODIUM CHLORIDE 0.9 % IV SOLN
INTRAVENOUS | Status: DC
  Administered 2015-11-21: 11:00:00 via INTRAVENOUS

## 2015-11-21 MED ORDER — MEPERIDINE HCL 50 MG/ML IJ SOLN
INTRAMUSCULAR | Status: DC | PRN
  Administered 2015-11-21 (×2): 25 mg via INTRAVENOUS

## 2015-11-21 MED ORDER — STERILE WATER FOR IRRIGATION IR SOLN
Status: DC | PRN
  Administered 2015-11-21: 12:00:00

## 2015-11-21 NOTE — Discharge Instructions (Signed)
Resume usual medications and high fiber diet. No driving for 24 hours. Next exam in 5 years.   High-Fiber Diet Fiber, also called dietary fiber, is a type of carbohydrate found in fruits, vegetables, whole grains, and beans. A high-fiber diet can have many health benefits. Your health care provider may recommend a high-fiber diet to help:  Prevent constipation. Fiber can make your bowel movements more regular.  Lower your cholesterol.  Relieve hemorrhoids, uncomplicated diverticulosis, or irritable bowel syndrome.  Prevent overeating as part of a weight-loss plan.  Prevent heart disease, type 2 diabetes, and certain cancers. WHAT IS MY PLAN? The recommended daily intake of fiber includes:  38 grams for men under age 63.  10 grams for men over age 70.  103 grams for women under age 27.  70 grams for women over age 9. You can get the recommended daily intake of dietary fiber by eating a variety of fruits, vegetables, grains, and beans. Your health care provider may also recommend a fiber supplement if it is not possible to get enough fiber through your diet. WHAT DO I NEED TO KNOW ABOUT A HIGH-FIBER DIET?  Fiber supplements have not been widely studied for their effectiveness, so it is better to get fiber through food sources.  Always check the fiber content on thenutrition facts label of any prepackaged food. Look for foods that contain at least 5 grams of fiber per serving.  Ask your dietitian if you have questions about specific foods that are related to your condition, especially if those foods are not listed in the following section.  Increase your daily fiber consumption gradually. Increasing your intake of dietary fiber too quickly may cause bloating, cramping, or gas.  Drink plenty of water. Water helps you to digest fiber. WHAT FOODS CAN I EAT? Grains Whole-grain breads. Multigrain cereal. Oats and oatmeal. Brown rice. Barley. Bulgur wheat. [place]. Bran muffins.  Popcorn. Rye wafer crackers. Vegetables Sweet potatoes. Spinach. Kale. Artichokes. Cabbage. Broccoli. Green peas. Carrots. Squash. Fruits Berries. Pears. Apples. Oranges. Avocados. Prunes and raisins. Dried figs. Meats and Other Protein Sources Navy, kidney, pinto, and soy beans. Split peas. Lentils. Nuts and seeds. Dairy Fiber-fortified yogurt. Beverages Fiber-fortified soy milk. Fiber-fortified orange juice. Other Fiber bars. The items listed above may not be a complete list of recommended foods or beverages. Contact your dietitian for more options. WHAT FOODS ARE NOT RECOMMENDED? Grains White bread. Pasta made with refined flour. White rice. Vegetables Fried potatoes. Canned vegetables. Well-cooked vegetables.  Fruits Fruit juice. Cooked, strained fruit. Meats and Other Protein Sources Fatty cuts of meat. Fried Sales executive or fried fish. Dairy Milk. Yogurt. Cream cheese. Sour cream. Beverages Soft drinks. Other Cakes and pastries. Butter and oils. The items listed above may not be a complete list of foods and beverages to avoid. Contact your dietitian for more information. WHAT ARE SOME TIPS FOR INCLUDING HIGH-FIBER FOODS IN MY DIET?  Eat a wide variety of high-fiber foods.  Make sure that half of all grains consumed each day are whole grains.  Replace breads and cereals made from refined flour or white flour with whole-grain breads and cereals.  Replace white rice with brown rice, bulgur wheat, or millet.  Start the day with a breakfast that is high in fiber, such as a cereal that contains at least 5 grams of fiber per serving.  Use beans in place of meat in soups, salads, or pasta.  Eat high-fiber snacks, such as berries, raw vegetables, nuts, or popcorn.   This  information is not intended to replace advice given to you by your health care provider. Make sure you discuss any questions you have with your health care provider.   Document Released: 04/28/2005 Document  Revised: 05/19/2014 Document Reviewed: 10/11/2013 Elsevier Interactive Patient Education 2016 [employer] Colonoscopy, Care After Refer to this sheet in the next few weeks. These instructions provide you with information on caring for yourself after your procedure. Your health care provider may also give you more specific instructions. Your treatment has been planned according to current medical practices, but problems sometimes occur. Call your health care provider if you have any problems or questions after your procedure. WHAT TO EXPECT AFTER THE PROCEDURE  After your procedure, it is typical to have the following:  A small amount of blood in your stool.  Moderate amounts of gas and mild abdominal cramping or bloating. HOME CARE INSTRUCTIONS  Do not drive, operate machinery, or sign important documents for 24 hours.  You may shower and resume your regular physical activities, but move at a slower pace for the first 24 hours.  Take frequent rest periods for the first 24 hours.  Walk around or put a warm pack on your abdomen to help reduce abdominal cramping and bloating.  Drink enough fluids to keep your urine clear or pale yellow.  You may resume your normal diet as instructed by your health care provider. Avoid heavy or fried foods that are hard to digest.  Avoid drinking alcohol for 24 hours or as instructed by your health care provider.  Only take over-the-counter or prescription medicines as directed by your health care provider.  If a tissue sample (biopsy) was taken during your procedure:  Do not take aspirin or blood thinners for 7 days, or as instructed by your health care provider.  Do not drink alcohol for 7 days, or as instructed by your health care provider.  Eat soft foods for the first 24 hours. SEEK MEDICAL CARE IF: You have persistent spotting of blood in your stool 2-3 days after the procedure. SEEK IMMEDIATE MEDICAL CARE IF:  You have more than a small  spotting of blood in your stool.  You pass large blood clots in your stool.  Your abdomen is swollen (distended).  You have nausea or vomiting.  You have a fever.  You have increasing abdominal pain that is not relieved with medicine.   This information is not intended to replace advice given to you by your health care provider. Make sure you discuss any questions you have with your health care provider.   Document Released: 12/11/2003 Document Revised: 02/16/2013 Document Reviewed: 01/03/2013 Elsevier Interactive Patient Education [employer].

## 2015-11-21 NOTE — H&P (Signed)
Sonya [name] is an 75 y.o. female.   Chief Complaint: Patient is here for colonoscopy. HPI: 75 year old Caucasian female was here for surveillance colonoscopy. She has history of colonic polyps and family history of CRC in mother who was 67 at the time of diagnosis. Maternal grandmother also had colon carcinoma at 40. Patient denies rectal bleeding. She has irregular bowel movements. He has IBS.  Past Medical History  Diagnosis Date  . GERD (gastroesophageal reflux disease)   . IBS (irritable bowel syndrome)   . Chronic diarrhea   . Arthritis   . Cancer ([place])     lt mastectomy-breast  . Cancer ([place])     Left Breast  . Breast cancer [facility])     Past Surgical History  Procedure Laterality Date  . Mastectomy  1990    lt mast  . Upper gi endoscopy    . Colonoscopy    . Carpal tunnel release  2015    left  . Melanoma excision      leg  . Carpal tunnel release Right 07/19/2014    Procedure: RIGHT CARPAL TUNNEL RELEASE AND TENOSYNOVECTOMY;  Surgeon: Leandrew Koyanagi, MD;  Location: [facility] [place];  Service: Orthopedics;  Laterality: Right;  . Tenosynovectomy Right 07/19/2014    Procedure: TENOSYNOVECTOMY;  Surgeon: Leandrew Koyanagi, MD;  Location: [place];  Service: Orthopedics;  Laterality: Right;  . Tonsillectomy    . Abdominal hysterectomy    . Breast surgery    . Carpal tunnel release Left 05/20/2013    Procedure: CARPAL TUNNEL RELEASE;  Surgeon: Carole Civil, MD;  Location: [facility];  Service: Orthopedics;  Laterality: Left;    Family History  Problem Relation Age of Onset  . Colon cancer Mother    Social History:  reports that she quit smoking about 28 years ago. Her smoking use included Cigarettes. She has a 5 pack-year smoking history. She does not have any smokeless tobacco history on file. She reports that she drinks about 1.2 oz of alcohol per week. She reports that she does not use illicit drugs.  Allergies:  Allergies  Allergen Reactions  .  Celebrex [Celecoxib]     REACTION: rash, sob, edema, reddness  . Celebrex [Celecoxib] Rash    Medications Prior to Admission  Medication Sig Dispense Refill  . loperamide (IMODIUM) 2 MG capsule Take by mouth as needed for diarrhea or loose stools.    . polyethylene glycol-electrolytes (TRILYTE) 420 g solution Take 4,000 mLs by mouth once. 4000 mL 0  . raloxifene (EVISTA) 60 MG tablet Take 60 mg by mouth daily.    . ranitidine (ZANTAC) 75 MG tablet Take 75 mg by mouth 2 (two) times daily.      No results found for this or any previous visit (from the past 48 hour(s)). No results found.  ROS  Blood pressure 152/96, pulse 88, temperature 97.7 F (36.5 C), temperature source Oral, resp. rate 12, height 5' 3.5" (1.613 m), weight 148 lb (67.132 kg), SpO2 98 %. Physical Exam  Constitutional: She appears well-developed and well-nourished.  HENT:  Mouth/Throat: Oropharynx is clear and moist.  Eyes: Conjunctivae are normal. No scleral icterus.  Neck: No thyromegaly present.  Cardiovascular: Normal rate, regular rhythm and normal heart sounds.   No murmur heard. Respiratory: Effort normal and breath sounds normal.  GI: Soft. She exhibits no distension and no mass. There is no tenderness.  Musculoskeletal: She exhibits no edema.  Lymphadenopathy:    She has no  cervical adenopathy.  Neurological: She is alert.  Skin: Skin is warm and dry.     Assessment/Plan History of colonic polyps and family history of CRC. Surveillance colonoscopy.  Hildred Laser, MD 11/21/2015, 11:36 AM

## 2015-11-21 NOTE — Op Note (Signed)
[facility] Patient Name: Sonya [name] Procedure Date: 11/21/2015 11:31 AM MRN: [id number] Date of Birth: 02-05-1941 Attending MD: Hildred Laser , MD CSN: [id number] Age: 75 Admit Type: Outpatient Procedure:                Colonoscopy Indications:              Screening for colorectal malignant neoplasm, High                            risk colon cancer surveillance: Personal history of                            colonic polyps, Family history of colon cancer in a                            first-degree relative Providers:                Hildred Laser, MD, Rosina Lowenstein, RN, Randa Spike, Technician Referring MD:             Asencion Noble, MD Medicines:                Meperidine 50 mg IV, Midazolam 8 mg IV Complications:            No immediate complications. Estimated Blood Loss:     Estimated blood loss: none. Procedure:                Pre-Anesthesia Assessment:                           - Prior to the procedure, a History and Physical                            was performed, and patient medications and                            allergies were reviewed. The patient's tolerance of                            previous anesthesia was also reviewed. The risks                            and benefits of the procedure and the sedation                            options and risks were discussed with the patient.                            All questions were answered, and informed consent                            was obtained. Prior Anticoagulants: The patient has  taken no previous anticoagulant or antiplatelet                            agents. ASA Grade Assessment: I - A normal, healthy                            patient. After reviewing the risks and benefits,                            the patient was deemed in satisfactory condition to                            undergo the procedure.                           After  obtaining informed consent, the colonoscope                            was passed under direct vision. Throughout the                            procedure, the patient's blood pressure, pulse, and                            oxygen saturations were monitored continuously. The                            [id number]) scope was introduced through                            the anus and advanced to the the cecum, identified                            by appendiceal orifice and ileocecal valve. The                            colonoscopy was somewhat difficult due to                            significant looping. Successful completion of the                            procedure was aided by changing the patient to a                            supine position and applying abdominal pressure.                            The patient tolerated the procedure well. The                            quality of the bowel preparation was excellent. The  ileocecal valve, appendiceal orifice, and rectum                            were photographed. Scope In: 11:47:11 AM Scope Out: 12:10:09 PM Scope Withdrawal Time: 0 hours 7 minutes 1 second  Total Procedure Duration: 0 hours 22 minutes 58 seconds  Findings:      A few small and large-mouthed diverticula were found in the sigmoid       colon, descending colon, splenic flexure and hepatic flexure.      The exam was otherwise normal throughout the examined colon.      External hemorrhoids were found during retroflexion. The hemorrhoids       were small. Impression:               - Diverticulosis in the sigmoid colon, in the                            descending colon, at the splenic flexure and at the                            hepatic flexure.                           - External hemorrhoids.                           - No specimens collected. Moderate Sedation:      Moderate (conscious) sedation was administered by the  endoscopy nurse       and supervised by the endoscopist. The following parameters were       monitored: oxygen saturation, heart rate, blood pressure, CO2       capnography and response to care. Total physician intraservice time was       30 minutes. Recommendation:           - Patient has a contact number available for                            emergencies. The signs and symptoms of potential                            delayed complications were discussed with the                            patient. Return to normal activities tomorrow.                            Written discharge instructions were provided to the                            patient.                           - High fiber diet today.                           - Continue present medications.                           -  Repeat colonoscopy in 5 years for surveillance. Procedure Code(s):        --- Professional ---                           [phone], Colonoscopy, flexible; diagnostic, including                            collection of specimen(s) by brushing or washing,                            when performed (separate procedure)                           99152, Moderate sedation services provided by the                            same physician or other qualified health care                            professional performing the diagnostic or                            therapeutic service that the sedation supports,                            requiring the presence of an independent trained                            observer to assist in the monitoring of the                            patient's level of consciousness and physiological                            status; initial 15 minutes of intraservice time,                            patient age 89 years or older                           [phone], Moderate sedation services; each additional                            15 minutes intraservice time Diagnosis Code(s):         --- Professional ---                           Z12.11, Encounter for screening for malignant                            neoplasm of colon                           Z86.010, Personal history of colonic polyps  K64.4, Residual hemorrhoidal skin tags                           Z80.0, Family history of malignant neoplasm of                            digestive organs                           K57.30, Diverticulosis of large intestine without                            perforation or abscess without bleeding CPT copyright 2016 American Medical Association. All rights reserved. The codes documented in this report are preliminary and upon coder review may  be revised to meet current compliance requirements. Hildred Laser, MD Hildred Laser, MD 11/21/2015 12:16:30 PM This report has been signed electronically. Number of Addenda: 0

## 2015-11-23 ENCOUNTER — Encounter (HOSPITAL_COMMUNITY): Payer: Self-pay | Admitting: Internal Medicine

## 2016-02-06 DIAGNOSIS — L820 Inflamed seborrheic keratosis: Secondary | ICD-10-CM | POA: Diagnosis not present

## 2016-02-06 DIAGNOSIS — Z85828 Personal history of other malignant neoplasm of skin: Secondary | ICD-10-CM | POA: Diagnosis not present

## 2016-02-06 DIAGNOSIS — D485 Neoplasm of uncertain behavior of skin: Secondary | ICD-10-CM | POA: Diagnosis not present

## 2016-02-06 DIAGNOSIS — L814 Other melanin hyperpigmentation: Secondary | ICD-10-CM | POA: Diagnosis not present

## 2016-02-06 DIAGNOSIS — L821 Other seborrheic keratosis: Secondary | ICD-10-CM | POA: Diagnosis not present

## 2016-02-25 ENCOUNTER — Other Ambulatory Visit (HOSPITAL_COMMUNITY): Payer: Self-pay | Admitting: Internal Medicine

## 2016-02-25 DIAGNOSIS — Z1231 Encounter for screening mammogram for malignant neoplasm of breast: Secondary | ICD-10-CM

## 2016-03-03 ENCOUNTER — Ambulatory Visit (HOSPITAL_COMMUNITY): Payer: Self-pay

## 2016-03-10 ENCOUNTER — Inpatient Hospital Stay (HOSPITAL_COMMUNITY): Admission: RE | Admit: 2016-03-10 | Payer: Self-pay | Source: Ambulatory Visit

## 2016-03-13 ENCOUNTER — Ambulatory Visit (HOSPITAL_COMMUNITY)
Admission: RE | Admit: 2016-03-13 | Discharge: 2016-03-13 | Disposition: A | Discharge status: Home / Self Care | Payer: Medicare Other | Source: Ambulatory Visit | Attending: Internal Medicine | Admitting: Internal Medicine

## 2016-03-13 DIAGNOSIS — Z1231 Encounter for screening mammogram for malignant neoplasm of breast: Secondary | ICD-10-CM | POA: Diagnosis not present

## 2016-04-01 DIAGNOSIS — H5203 Hypermetropia, bilateral: Secondary | ICD-10-CM | POA: Diagnosis not present

## 2016-04-01 DIAGNOSIS — H52203 Unspecified astigmatism, bilateral: Secondary | ICD-10-CM | POA: Diagnosis not present

## 2016-04-01 DIAGNOSIS — H524 Presbyopia: Secondary | ICD-10-CM | POA: Diagnosis not present

## 2016-04-01 DIAGNOSIS — H25013 Cortical age-related cataract, bilateral: Secondary | ICD-10-CM | POA: Diagnosis not present

## 2016-04-01 DIAGNOSIS — H2513 Age-related nuclear cataract, bilateral: Secondary | ICD-10-CM | POA: Diagnosis not present

## 2016-06-13 DIAGNOSIS — M810 Age-related osteoporosis without current pathological fracture: Secondary | ICD-10-CM | POA: Diagnosis not present

## 2016-06-13 DIAGNOSIS — Z79899 Other long term (current) drug therapy: Secondary | ICD-10-CM | POA: Diagnosis not present

## 2016-06-13 DIAGNOSIS — K589 Irritable bowel syndrome without diarrhea: Secondary | ICD-10-CM | POA: Diagnosis not present

## 2016-06-13 DIAGNOSIS — C50919 Malignant neoplasm of unspecified site of unspecified female breast: Secondary | ICD-10-CM | POA: Diagnosis not present

## 2016-06-20 DIAGNOSIS — M810 Age-related osteoporosis without current pathological fracture: Secondary | ICD-10-CM | POA: Diagnosis not present

## 2016-06-20 DIAGNOSIS — R319 Hematuria, unspecified: Secondary | ICD-10-CM | POA: Diagnosis not present

## 2016-06-20 DIAGNOSIS — Z0001 Encounter for general adult medical examination with abnormal findings: Secondary | ICD-10-CM | POA: Diagnosis not present

## 2016-06-24 ENCOUNTER — Other Ambulatory Visit (HOSPITAL_COMMUNITY): Payer: Self-pay | Admitting: Internal Medicine

## 2016-06-24 DIAGNOSIS — Z780 Asymptomatic menopausal state: Secondary | ICD-10-CM

## 2016-06-27 ENCOUNTER — Other Ambulatory Visit (HOSPITAL_COMMUNITY): Payer: Self-pay

## 2016-06-27 ENCOUNTER — Encounter (HOSPITAL_COMMUNITY): Payer: Self-pay

## 2016-07-02 ENCOUNTER — Other Ambulatory Visit (HOSPITAL_COMMUNITY): Payer: Self-pay | Admitting: Internal Medicine

## 2016-07-02 DIAGNOSIS — Z85828 Personal history of other malignant neoplasm of skin: Secondary | ICD-10-CM | POA: Diagnosis not present

## 2016-07-02 DIAGNOSIS — L821 Other seborrheic keratosis: Secondary | ICD-10-CM | POA: Diagnosis not present

## 2016-07-02 DIAGNOSIS — L570 Actinic keratosis: Secondary | ICD-10-CM | POA: Diagnosis not present

## 2016-07-02 DIAGNOSIS — Z780 Asymptomatic menopausal state: Secondary | ICD-10-CM

## 2016-07-03 ENCOUNTER — Ambulatory Visit (HOSPITAL_COMMUNITY)
Admission: RE | Admit: 2016-07-03 | Discharge: 2016-07-03 | Disposition: A | Discharge status: Home / Self Care | Payer: Medicare Other | Source: Ambulatory Visit | Attending: Internal Medicine | Admitting: Internal Medicine

## 2016-07-03 DIAGNOSIS — M810 Age-related osteoporosis without current pathological fracture: Secondary | ICD-10-CM | POA: Diagnosis not present

## 2016-07-03 DIAGNOSIS — Z780 Asymptomatic menopausal state: Secondary | ICD-10-CM | POA: Insufficient documentation

## 2016-07-04 DIAGNOSIS — R319 Hematuria, unspecified: Secondary | ICD-10-CM | POA: Diagnosis not present

## 2016-08-14 ENCOUNTER — Ambulatory Visit (INDEPENDENT_AMBULATORY_CARE_PROVIDER_SITE_OTHER): Payer: Medicare Other | Admitting: Internal Medicine

## 2016-08-14 ENCOUNTER — Encounter (INDEPENDENT_AMBULATORY_CARE_PROVIDER_SITE_OTHER): Payer: Self-pay | Admitting: Internal Medicine

## 2016-08-14 ENCOUNTER — Encounter (INDEPENDENT_AMBULATORY_CARE_PROVIDER_SITE_OTHER): Payer: Self-pay

## 2016-08-14 VITALS — BP 158/80 | HR 72 | Ht 64.0 in | Wt 150.1 lb

## 2016-08-14 DIAGNOSIS — K580 Irritable bowel syndrome with diarrhea: Secondary | ICD-10-CM | POA: Diagnosis not present

## 2016-08-14 MED ORDER — HYOSCYAMINE SULFATE 0.125 MG SL SUBL: 0.1250 mg | SUBLINGUAL_TABLET | SUBLINGUAL | 2 refills | Status: DC | PRN

## 2016-08-14 NOTE — Patient Instructions (Signed)
As needed

## 2016-08-14 NOTE — Progress Notes (Signed)
Subjective:    Patient ID: Sonya [name], female    DOB: [birth year], 76 y.o.   MRN: [id number]  HPI Here today for f/u. She is planning a trip to [place] and would like medication for her IBS.  Hx of same and per notes has been hard to control. She has urgency. Usually has a BM daily. She has bloating and gas.  Her appetite is good. No weight loss. No melena or BRRB.      11/21/2015 Colonoscopy:  Screening. Personal hx of colon polyps. Family hx of colon cancer in a 1st degree relative:  Impression:               - Diverticulosis in the sigmoid colon, in the                            descending colon, at the splenic flexure and at the                            hepatic flexure.                           - External hemorrhoids.     Review of Systems   Past Medical History:  Diagnosis Date  . Arthritis   . Breast cancer ([place])   . Cancer ([facility])    lt mastectomy-breast  . Cancer ([place])    Left Breast  . Chronic diarrhea   . GERD (gastroesophageal reflux disease)   . IBS (irritable bowel syndrome)     Past Surgical History:  Procedure Laterality Date  . ABDOMINAL HYSTERECTOMY    . BREAST SURGERY    . CARPAL TUNNEL RELEASE  2015   left  . CARPAL TUNNEL RELEASE Right 07/19/2014   Procedure: RIGHT CARPAL TUNNEL RELEASE AND TENOSYNOVECTOMY;  Surgeon: Leandrew Koyanagi, MD;  Location: [place];  Service: Orthopedics;  Laterality: Right;  . CARPAL TUNNEL RELEASE Left 05/20/2013   Procedure: CARPAL TUNNEL RELEASE;  Surgeon: Carole Civil, MD;  Location: [facility];  Service: Orthopedics;  Laterality: Left;  . COLONOSCOPY    . COLONOSCOPY N/A 11/21/2015   Procedure: COLONOSCOPY;  Surgeon: Rogene Houston, MD;  Location: [facility];  Service: Endoscopy;  Laterality: N/A;  1200  . MASTECTOMY  1990   lt mast  . MELANOMA EXCISION     leg  . TENOSYNOVECTOMY Right 07/19/2014   Procedure: TENOSYNOVECTOMY;  Surgeon: Leandrew Koyanagi, MD;  Location: [place];   Service: Orthopedics;  Laterality: Right;  . TONSILLECTOMY    . UPPER GI ENDOSCOPY      Allergies  Allergen Reactions  . Celebrex [Celecoxib]     REACTION: rash, sob, edema, reddness  . Celebrex [Celecoxib] Rash    Current Outpatient Prescriptions on File Prior to Visit  Medication Sig Dispense Refill  . loperamide (IMODIUM) 2 MG capsule Take by mouth as needed for diarrhea or loose stools.    . raloxifene (EVISTA) 60 MG tablet Take 60 mg by mouth daily.    . ranitidine (ZANTAC) 75 MG tablet Take 75 mg by mouth 2 (two) times daily.     No current facility-administered medications on file prior to visit.           Objective:   Physical Exam Blood pressure (!) 158/80, pulse 72, height 5\' 4"  (1.626 m), weight  150 lb 1.6 oz (68.1 kg). Alert and oriented. Skin warm and dry. Oral mucosa is moist.   . Sclera anicteric, conjunctivae is pink. Thyroid not enlarged. No cervical lymphadenopathy. Lungs clear. Heart regular rate and rhythm.  Abdomen is soft. Bowel sounds are positive. No hepatomegaly. No abdominal masses felt. No tenderness.  No edema to lower extremities.         Assessment & Plan:  IBS. Am going to call Rx in for Levsin. She will call me in 2 weeks and let me know how she is doing.

## 2017-01-16 DIAGNOSIS — M19071 Primary osteoarthritis, right ankle and foot: Secondary | ICD-10-CM | POA: Diagnosis not present

## 2017-01-16 DIAGNOSIS — M19072 Primary osteoarthritis, left ankle and foot: Secondary | ICD-10-CM | POA: Diagnosis not present

## 2017-01-16 DIAGNOSIS — M2012 Hallux valgus (acquired), left foot: Secondary | ICD-10-CM | POA: Diagnosis not present

## 2017-01-16 DIAGNOSIS — M79672 Pain in left foot: Secondary | ICD-10-CM | POA: Diagnosis not present

## 2017-01-16 DIAGNOSIS — M2011 Hallux valgus (acquired), right foot: Secondary | ICD-10-CM | POA: Diagnosis not present

## 2017-01-16 DIAGNOSIS — M7732 Calcaneal spur, left foot: Secondary | ICD-10-CM | POA: Diagnosis not present

## 2017-01-16 DIAGNOSIS — M7731 Calcaneal spur, right foot: Secondary | ICD-10-CM | POA: Diagnosis not present

## 2017-02-16 ENCOUNTER — Other Ambulatory Visit (HOSPITAL_COMMUNITY): Payer: Self-pay | Admitting: Internal Medicine

## 2017-02-16 DIAGNOSIS — Z1231 Encounter for screening mammogram for malignant neoplasm of breast: Secondary | ICD-10-CM

## 2017-03-16 ENCOUNTER — Encounter (HOSPITAL_COMMUNITY): Payer: Self-pay

## 2017-03-16 ENCOUNTER — Ambulatory Visit (HOSPITAL_COMMUNITY)
Admission: RE | Admit: 2017-03-16 | Discharge: 2017-03-16 | Disposition: A | Discharge status: Home / Self Care | Payer: Medicare Other | Source: Ambulatory Visit | Attending: Internal Medicine | Admitting: Internal Medicine

## 2017-03-16 DIAGNOSIS — Z1231 Encounter for screening mammogram for malignant neoplasm of breast: Secondary | ICD-10-CM | POA: Insufficient documentation

## 2017-03-16 DIAGNOSIS — K219 Gastro-esophageal reflux disease without esophagitis: Secondary | ICD-10-CM | POA: Diagnosis not present

## 2017-03-18 ENCOUNTER — Other Ambulatory Visit (INDEPENDENT_AMBULATORY_CARE_PROVIDER_SITE_OTHER): Payer: Self-pay | Admitting: *Deleted

## 2017-03-18 DIAGNOSIS — R1310 Dysphagia, unspecified: Secondary | ICD-10-CM

## 2017-03-18 DIAGNOSIS — R1319 Other dysphagia: Secondary | ICD-10-CM

## 2017-03-23 ENCOUNTER — Ambulatory Visit (HOSPITAL_COMMUNITY)
Admission: RE | Admit: 2017-03-23 | Discharge: 2017-03-23 | Disposition: A | Discharge status: Home / Self Care | Payer: Medicare Other | Source: Ambulatory Visit | Attending: Internal Medicine | Admitting: Internal Medicine

## 2017-03-23 ENCOUNTER — Encounter (HOSPITAL_COMMUNITY)
Admission: RE | Disposition: A | Discharge status: Home / Self Care | Payer: Self-pay | Source: Ambulatory Visit | Attending: Internal Medicine

## 2017-03-23 ENCOUNTER — Other Ambulatory Visit: Payer: Self-pay

## 2017-03-23 ENCOUNTER — Encounter (HOSPITAL_COMMUNITY): Payer: Self-pay | Admitting: *Deleted

## 2017-03-23 DIAGNOSIS — K222 Esophageal obstruction: Secondary | ICD-10-CM | POA: Diagnosis not present

## 2017-03-23 DIAGNOSIS — K529 Noninfective gastroenteritis and colitis, unspecified: Secondary | ICD-10-CM | POA: Diagnosis not present

## 2017-03-23 DIAGNOSIS — Z79899 Other long term (current) drug therapy: Secondary | ICD-10-CM | POA: Insufficient documentation

## 2017-03-23 DIAGNOSIS — Z9012 Acquired absence of left breast and nipple: Secondary | ICD-10-CM | POA: Diagnosis not present

## 2017-03-23 DIAGNOSIS — Z853 Personal history of malignant neoplasm of breast: Secondary | ICD-10-CM | POA: Insufficient documentation

## 2017-03-23 DIAGNOSIS — Z87891 Personal history of nicotine dependence: Secondary | ICD-10-CM | POA: Diagnosis not present

## 2017-03-23 DIAGNOSIS — R1314 Dysphagia, pharyngoesophageal phase: Secondary | ICD-10-CM | POA: Insufficient documentation

## 2017-03-23 DIAGNOSIS — K2270 Barrett's esophagus without dysplasia: Secondary | ICD-10-CM | POA: Diagnosis not present

## 2017-03-23 DIAGNOSIS — Z79810 Long term (current) use of selective estrogen receptor modulators (SERMs): Secondary | ICD-10-CM | POA: Diagnosis not present

## 2017-03-23 DIAGNOSIS — K228 Other specified diseases of esophagus: Secondary | ICD-10-CM | POA: Diagnosis not present

## 2017-03-23 DIAGNOSIS — K449 Diaphragmatic hernia without obstruction or gangrene: Secondary | ICD-10-CM | POA: Diagnosis not present

## 2017-03-23 DIAGNOSIS — R1310 Dysphagia, unspecified: Secondary | ICD-10-CM

## 2017-03-23 DIAGNOSIS — R1319 Other dysphagia: Secondary | ICD-10-CM

## 2017-03-23 DIAGNOSIS — K219 Gastro-esophageal reflux disease without esophagitis: Secondary | ICD-10-CM | POA: Diagnosis not present

## 2017-03-23 DIAGNOSIS — Z85820 Personal history of malignant melanoma of skin: Secondary | ICD-10-CM | POA: Diagnosis not present

## 2017-03-23 HISTORY — PX: ESOPHAGEAL DILATION: SHX303

## 2017-03-23 HISTORY — PX: ESOPHAGOGASTRODUODENOSCOPY: SHX5428

## 2017-03-23 SURGERY — EGD (ESOPHAGOGASTRODUODENOSCOPY)
Anesthesia: Moderate Sedation

## 2017-03-23 MED ORDER — SODIUM CHLORIDE 0.9 % IV SOLN
INTRAVENOUS | Status: DC
  Administered 2017-03-23: 13:00:00 via INTRAVENOUS

## 2017-03-23 MED ORDER — MIDAZOLAM HCL 5 MG/5ML IJ SOLN
INTRAMUSCULAR | Status: AC
  Filled 2017-03-23: qty 10

## 2017-03-23 MED ORDER — LIDOCAINE VISCOUS 2 % MT SOLN
OROMUCOSAL | Status: DC | PRN
  Administered 2017-03-23: 1 via OROMUCOSAL

## 2017-03-23 MED ORDER — LIDOCAINE VISCOUS 2 % MT SOLN
OROMUCOSAL | Status: AC
  Filled 2017-03-23: qty 15

## 2017-03-23 MED ORDER — STERILE WATER FOR IRRIGATION IR SOLN
Status: DC | PRN
  Administered 2017-03-23: 15 mL

## 2017-03-23 MED ORDER — MIDAZOLAM HCL 5 MG/5ML IJ SOLN
INTRAMUSCULAR | Status: DC | PRN
  Administered 2017-03-23 (×3): 2 mg via INTRAVENOUS

## 2017-03-23 MED ORDER — MEPERIDINE HCL 50 MG/ML IJ SOLN
INTRAMUSCULAR | Status: DC | PRN
  Administered 2017-03-23 (×2): 25 mg via INTRAVENOUS

## 2017-03-23 MED ORDER — MEPERIDINE HCL 50 MG/ML IJ SOLN
INTRAMUSCULAR | Status: AC
  Filled 2017-03-23: qty 1

## 2017-03-23 NOTE — Op Note (Signed)
[facility] Patient Name: Sonya [name] Procedure Date: 03/23/2017 1:46 PM MRN: [id number] Date of Birth: 1940-10-12 Attending MD: Hildred Laser , MD CSN: [id number] Age: 76 Admit Type: Outpatient Procedure:                Upper GI endoscopy Indications:              Esophageal dysphagia, Gastro-esophageal reflux                            disease Providers:                Hildred Laser, MD, Charlsie Quest. Theda Sers RN, RN,                            Randa Spike, Technician Referring MD:             Asencion Noble, MD Medicines:                Lidocaine spray, Meperidine 50 mg IV, Midazolam 9                            mg IV Complications:            No immediate complications. Estimated Blood Loss:     Estimated blood loss was minimal. Procedure:                Pre-Anesthesia Assessment:                           - Prior to the procedure, a History and Physical                            was performed, and patient medications and                            allergies were reviewed. The patient's tolerance of                            previous anesthesia was also reviewed. The risks                            and benefits of the procedure and the sedation                            options and risks were discussed with the patient.                            All questions were answered, and informed consent                            was obtained. Prior Anticoagulants: The patient has                            taken no previous anticoagulant or antiplatelet                            agents.  ASA Grade Assessment: II - A patient with                            mild systemic disease. After reviewing the risks                            and benefits, the patient was deemed in                            satisfactory condition to undergo the procedure.                           After obtaining informed consent, the endoscope was                            passed under direct vision.  Throughout the                            procedure, the patient's blood pressure, pulse, and                            oxygen saturations were monitored continuously. The                            [id number]) scope was introduced through the                            mouth, and advanced to the second part of duodenum.                            The upper GI endoscopy was accomplished without                            difficulty. The patient tolerated the procedure                            well. Scope In: 2:23:18 PM Scope Out: 2:38:48 PM Total Procedure Duration: 0 hours 15 minutes 30 seconds  Findings:      The proximal esophagus and mid esophagus were normal.      There were esophageal mucosal changes suspicious for short-segment       Barrett's esophagus present in the distal esophagus. The maximum       longitudinal extent of these mucosal changes was 2 cm in length. Mucosa       was biopsied with a cold forceps for histology randomly. One specimen       bottle was sent to pathology.      A moderate Schatzki ring (acquired) was found at the gastroesophageal       junction. The scope was withdrawn. Dilation was performed with a Maloney       dilator with mild resistance at 55 Fr. The dilation site was examined       following endoscope reinsertion and showed moderate improvement in       luminal narrowing and no perforation. Ring further disrupted with focal  biopsy.      A 2 cm hiatal hernia was present.      The entire examined stomach was normal.      The duodenal bulb and second portion of the duodenum were normal. Impression:               - Normal proximal esophagus and mid esophagus.                           - Esophageal mucosal changes suspicious for                            short-segment Barrett's esophagus. Biopsied.                           - Moderate narrowing due to Schatzki ring. Dilated.                            Ring disrupted further with  focal biopsy but no                            tissue saved.                           - 2 cm hiatal hernia.                           - Normal stomach.                           - Normal duodenal bulb and second portion of the                            duodenum. Moderate Sedation:      Moderate (conscious) sedation was administered by the endoscopy nurse       and supervised by the endoscopist. The following parameters were       monitored: oxygen saturation, heart rate, blood pressure, CO2       capnography and response to care. Total physician intraservice time was       18 minutes. Recommendation:           - Patient has a contact number available for                            emergencies. The signs and symptoms of potential                            delayed complications were discussed with the                            patient. Return to normal activities tomorrow.                            Written discharge instructions were provided to the                            patient.                           -  Resume previous diet today.                           - Continue present medications.                           - No aspirin, ibuprofen, naproxen, or other                            non-steroidal anti-inflammatory drugs for 3 days.                           - Await pathology results.                           - Repeat upper endoscopy PRN. Procedure Code(s):        --- Professional ---                           [phone], Esophagogastroduodenoscopy, flexible,                            transoral; with biopsy, single or multiple                           43450, Dilation of esophagus, by unguided sound or                            bougie, single or multiple passes                           99152, Moderate sedation services provided by the                            same physician or other qualified health care                            professional performing the diagnostic or                             therapeutic service that the sedation supports,                            requiring the presence of an independent trained                            observer to assist in the monitoring of the                            patient's level of consciousness and physiological                            status; initial 15 minutes of intraservice time,                            patient age 72  years or older Diagnosis Code(s):        --- Professional ---                           K22.8, Other specified diseases of esophagus                           K22.2, Esophageal obstruction                           K44.9, Diaphragmatic hernia without obstruction or                            gangrene                           R13.14, Dysphagia, pharyngoesophageal phase                           K21.9, Gastro-esophageal reflux disease without                            esophagitis CPT copyright 2016 American Medical Association. All rights reserved. The codes documented in this report are preliminary and upon coder review may  be revised to meet current compliance requirements. Hildred Laser, MD Hildred Laser, MD 03/23/2017 2:56:41 PM This report has been signed electronically. Number of Addenda: 0

## 2017-03-23 NOTE — Discharge Instructions (Signed)
No aspirin or NSAIDs for 3 days. Resume usual medications and diet. No driving for 24 hours. Physician will call with biopsy results.  Esophagogastroduodenoscopy, Care After Refer to this sheet in the next few weeks. These instructions provide you with information about caring for yourself after your procedure. Your health care provider may also give you more specific instructions. Your treatment has been planned according to current medical practices, but problems sometimes occur. Call your health care provider if you have any problems or questions after your procedure. What can I expect after the procedure? After the procedure, it is common to have:  A sore throat.  Nausea.  Bloating.  Dizziness.  Fatigue.  Follow these instructions at home:  Do not eat or drink anything until the numbing medicine (local anesthetic) has worn off and your gag reflex has returned. You will know that the local anesthetic has worn off when you can swallow comfortably.  Do not drive for 24 hours if you received a medicine to help you relax (sedative).  If your health care provider took a tissue sample for testing during the procedure, make sure to get your test results. This is your responsibility. Ask your health care provider or the department performing the test when your results will be ready.  Keep all follow-up visits as told by your health care provider. This is important. Contact a health care provider if:  You cannot stop coughing.  You are not urinating.  You are urinating less than usual. Get help right away if:  You have trouble swallowing.  You cannot eat or drink.  You have throat or chest pain that gets worse.  You are dizzy or light-headed.  You faint.  You have nausea or vomiting.  You have chills.  You have a fever.  You have severe abdominal pain.  You have black, tarry, or bloody stools. This information is not intended to replace advice given to you by your  health care provider. Make sure you discuss any questions you have with your health care provider. Document Released: 04/14/2012 Document Revised: 10/04/2015 Document Reviewed: 03/22/2015 Elsevier Interactive Patient Education  [employer].

## 2017-03-23 NOTE — H&P (Signed)
Sonya [name] is an 76 y.o. female.   Chief Complaint: Patient is here for EGD and ED. HPI: Patient is 76 year old Caucasian female who presents with 2-year history of intermittent solid food dysphagia.  It has been occurring more frequent lately maybe once a week.  She has had few episodes where she had to regurgitate food in order to get relief.  She points to the suprasternal area side of bolus obstruction.  She also has been experiencing frequent heartburn lately and has been using OTC medications.  She was seen by Dr. Willey Blade last week and begun on pantoprazole and she has had excellent control of heartburn.  She has good appetite.  She denies nausea vomiting hematemesis or melena.  Past Medical History:  Diagnosis Date  . Arthritis   . Breast cancer ([place])   . Cancer (HCC)    lt mastectomy-breast  . Cancer ([place])    Left Breast  . Chronic diarrhea   . GERD (gastroesophageal reflux disease)   . IBS (irritable bowel syndrome)     Past Surgical History:  Procedure Laterality Date  . ABDOMINAL HYSTERECTOMY    . BREAST SURGERY    . CARPAL TUNNEL RELEASE  2015   left  . COLONOSCOPY    . MASTECTOMY  1990   lt mast  . MELANOMA EXCISION     leg  . TONSILLECTOMY    . UPPER GI ENDOSCOPY      Family History  Problem Relation Age of Onset  . Colon cancer Mother    Social History:  reports that she quit smoking about 29 years ago. Her smoking use included cigarettes. She has a 5.00 pack-year smoking history. she has never used smokeless tobacco. She reports that she drinks about 1.2 oz of alcohol per week. She reports that she does not use drugs.  Allergies:  Allergies  Allergen Reactions  . Celebrex [Celecoxib] Shortness Of Breath, Swelling and Rash     reddness    Medications Prior to Admission  Medication Sig Dispense Refill  . loperamide (IMODIUM) 2 MG capsule Take 2 mg as needed by mouth for diarrhea or loose stools.     . Multiple Vitamins-Minerals (CENTRUM SILVER PO)  Take 1 tablet daily by mouth.    . pantoprazole (PROTONIX) 40 MG tablet Take 40 mg 2 (two) times daily by mouth.    . raloxifene (EVISTA) 60 MG tablet Take 60 mg by mouth daily.    . ranitidine (ZANTAC) 75 MG tablet Take 75 mg daily as needed by mouth for heartburn.       No results found for this or any previous visit (from the past 48 hour(s)). No results found.  ROS  Blood pressure (!) 157/75, pulse 79, temperature (!) 97.4 F (36.3 C), temperature source Oral, resp. rate 18, height 5\' 4"  (1.626 m), weight 140 lb (63.5 kg), SpO2 100 %. Physical Exam  Constitutional: She appears well-developed and well-nourished.  HENT:  Mouth/Throat: Oropharynx is clear and moist.  Eyes: Conjunctivae are normal. No scleral icterus.  Neck: No thyromegaly present.  Cardiovascular: Normal rate, regular rhythm and normal heart sounds.  No murmur heard. Respiratory: Effort normal and breath sounds normal.  GI: Soft. She exhibits no distension and no mass. There is no tenderness.  Musculoskeletal: She exhibits no edema.  Lymphadenopathy:    She has no cervical adenopathy.  Neurological: She is alert.  Skin: Skin is warm and dry.     Assessment/Plan Dysphagia in patient with GERD. EGD with ED.  Hildred Laser, MD 03/23/2017, 2:12 PM

## 2017-03-26 ENCOUNTER — Encounter (HOSPITAL_COMMUNITY): Payer: Self-pay | Admitting: Internal Medicine

## 2017-04-08 NOTE — Progress Notes (Signed)
A message was sent to Sonya [name] to find a spot where we can schedule this patient.

## 2017-04-23 DIAGNOSIS — H5203 Hypermetropia, bilateral: Secondary | ICD-10-CM | POA: Diagnosis not present

## 2017-04-23 DIAGNOSIS — H524 Presbyopia: Secondary | ICD-10-CM | POA: Diagnosis not present

## 2017-04-23 DIAGNOSIS — H52203 Unspecified astigmatism, bilateral: Secondary | ICD-10-CM | POA: Diagnosis not present

## 2017-04-23 DIAGNOSIS — H2513 Age-related nuclear cataract, bilateral: Secondary | ICD-10-CM | POA: Diagnosis not present

## 2017-04-23 DIAGNOSIS — H25013 Cortical age-related cataract, bilateral: Secondary | ICD-10-CM | POA: Diagnosis not present

## 2017-05-20 DIAGNOSIS — H25013 Cortical age-related cataract, bilateral: Secondary | ICD-10-CM | POA: Diagnosis not present

## 2017-05-20 DIAGNOSIS — H2513 Age-related nuclear cataract, bilateral: Secondary | ICD-10-CM | POA: Diagnosis not present

## 2017-05-27 DIAGNOSIS — H25011 Cortical age-related cataract, right eye: Secondary | ICD-10-CM | POA: Diagnosis not present

## 2017-05-27 DIAGNOSIS — H2511 Age-related nuclear cataract, right eye: Secondary | ICD-10-CM | POA: Diagnosis not present

## 2017-05-27 DIAGNOSIS — H25012 Cortical age-related cataract, left eye: Secondary | ICD-10-CM | POA: Diagnosis not present

## 2017-05-27 DIAGNOSIS — H2512 Age-related nuclear cataract, left eye: Secondary | ICD-10-CM | POA: Diagnosis not present

## 2017-06-10 DIAGNOSIS — H5703 Miosis: Secondary | ICD-10-CM | POA: Diagnosis not present

## 2017-06-10 DIAGNOSIS — H25012 Cortical age-related cataract, left eye: Secondary | ICD-10-CM | POA: Diagnosis not present

## 2017-06-10 DIAGNOSIS — H21562 Pupillary abnormality, left eye: Secondary | ICD-10-CM | POA: Diagnosis not present

## 2017-06-10 DIAGNOSIS — H2512 Age-related nuclear cataract, left eye: Secondary | ICD-10-CM | POA: Diagnosis not present

## 2017-06-16 ENCOUNTER — Encounter (INDEPENDENT_AMBULATORY_CARE_PROVIDER_SITE_OTHER): Payer: Self-pay | Admitting: Internal Medicine

## 2017-06-16 ENCOUNTER — Ambulatory Visit (INDEPENDENT_AMBULATORY_CARE_PROVIDER_SITE_OTHER): Payer: Medicare Other | Admitting: Internal Medicine

## 2017-06-16 VITALS — BP 128/70 | HR 74 | Temp 98.2°F | Resp 18 | Ht 64.0 in | Wt 151.9 lb

## 2017-06-16 DIAGNOSIS — K580 Irritable bowel syndrome with diarrhea: Secondary | ICD-10-CM

## 2017-06-16 DIAGNOSIS — K2270 Barrett's esophagus without dysplasia: Secondary | ICD-10-CM | POA: Diagnosis not present

## 2017-06-16 DIAGNOSIS — K219 Gastro-esophageal reflux disease without esophagitis: Secondary | ICD-10-CM | POA: Diagnosis not present

## 2017-06-16 MED ORDER — FAMOTIDINE 20 MG PO TABS: 20.0000 mg | ORAL_TABLET | Freq: Every evening | ORAL | Status: DC | PRN

## 2017-06-16 MED ORDER — PANTOPRAZOLE SODIUM 40 MG PO TBEC: 40.0000 mg | DELAYED_RELEASE_TABLET | Freq: Every day | ORAL | 3 refills | Status: DC

## 2017-06-16 NOTE — Patient Instructions (Signed)
Notify if you have breakthrough symptoms with change in your therapy.

## 2017-06-16 NOTE — Progress Notes (Signed)
Presenting complaint;  Follow-up for complicated GERD and IBS.  Database and subjective:  Patient is 77 year old Caucasian female who has a history of IBS.  She presented with solid food dysphagia and underwent EGD on 03/23/2017.  She was found to have short segment Barrett's esophagus, Schatzki's ring and a hiatal hernia.  Esophagus was dilated.  Esophageal biopsy confirmed Barrett's without dysplasia.  She was advised to take pantoprazole twice daily.  She also has been using IBgard for IBS.  She is here for scheduled visit.  She is doing much better.  She has not heartburn regurgitation or solid food dysphagia in the last 2 months.  She is having 4-5 bowel movements on most days.  Most of her stools are soft.  On most days she takes Imodium OTC 1 dose.  On her best days she has as many as 2/day.  She has not had any accidents.  She denies nocturnal bowel movements.  She had right cataract extraction 3 weeks ago and left phone last week with excellent results. She remains with good appetite and her weight has been stable. She is not having any side effects with PPI.    Current Medications: Outpatient Encounter Medications as of 06/16/2017  Medication Sig  . loperamide (IMODIUM) 2 MG capsule Take 2 mg as needed by mouth for diarrhea or loose stools.   . Multiple Vitamins-Minerals (CENTRUM SILVER PO) Take 1 tablet daily by mouth.  . pantoprazole (PROTONIX) 40 MG tablet Take 40 mg 2 (two) times daily by mouth.  . raloxifene (EVISTA) 60 MG tablet Take 60 mg by mouth daily.  . [DISCONTINUED] ranitidine (ZANTAC) 75 MG tablet Take 75 mg daily as needed by mouth for heartburn.    No facility-administered encounter medications on file as of 06/16/2017.      Objective: Blood pressure 128/70, pulse 74, temperature 98.2 F (36.8 C), temperature source Oral, resp. rate 18, height 5\' 4"  (1.626 m), weight 151 lb 14.4 oz (68.9 kg). Patient is alert and in no acute distress. Conjunctiva is pink. Sclera  is nonicteric Oropharyngeal mucosa is normal. No neck masses or thyromegaly noted. Cardiac exam with regular rhythm normal S1 and S2. No murmur or gallop noted. Lungs are clear to auscultation. Abdomen is soft and nontender without organomegaly or masses. No LE edema or clubbing noted.    Assessment:  #1.  Chronic GERD complicated by short segment Barrett's esophagus.  She is doing very well with therapy.  She is ready for dose reduction and PPI.  She can take OTC H2 B for breakthrough symptoms at night on as-needed basis. Given that she has Barrett's esophagus chronic PPI therapy would be recommended.  #2.  Irritable bowel syndrome.  She is doing better with OTC therapy.   Plan:  Decrease pantoprazole to 40 mg p.o. every morning. Famotidine OTC 20 mg p.o. nightly as needed. Office visit in 1 year.

## 2017-06-24 DIAGNOSIS — K589 Irritable bowel syndrome without diarrhea: Secondary | ICD-10-CM | POA: Diagnosis not present

## 2017-06-24 DIAGNOSIS — Z79899 Other long term (current) drug therapy: Secondary | ICD-10-CM | POA: Diagnosis not present

## 2017-06-24 DIAGNOSIS — R7301 Impaired fasting glucose: Secondary | ICD-10-CM | POA: Diagnosis not present

## 2017-06-24 DIAGNOSIS — C50919 Malignant neoplasm of unspecified site of unspecified female breast: Secondary | ICD-10-CM | POA: Diagnosis not present

## 2017-06-24 DIAGNOSIS — M810 Age-related osteoporosis without current pathological fracture: Secondary | ICD-10-CM | POA: Diagnosis not present

## 2017-06-30 DIAGNOSIS — Z0001 Encounter for general adult medical examination with abnormal findings: Secondary | ICD-10-CM | POA: Diagnosis not present

## 2017-06-30 DIAGNOSIS — Z23 Encounter for immunization: Secondary | ICD-10-CM | POA: Diagnosis not present

## 2017-06-30 DIAGNOSIS — Z853 Personal history of malignant neoplasm of breast: Secondary | ICD-10-CM | POA: Diagnosis not present

## 2017-06-30 DIAGNOSIS — R7301 Impaired fasting glucose: Secondary | ICD-10-CM | POA: Diagnosis not present

## 2017-08-17 ENCOUNTER — Ambulatory Visit (INDEPENDENT_AMBULATORY_CARE_PROVIDER_SITE_OTHER): Payer: Self-pay | Admitting: Internal Medicine

## 2017-08-26 DIAGNOSIS — L821 Other seborrheic keratosis: Secondary | ICD-10-CM | POA: Diagnosis not present

## 2017-08-26 DIAGNOSIS — L570 Actinic keratosis: Secondary | ICD-10-CM | POA: Diagnosis not present

## 2017-08-26 DIAGNOSIS — Z85828 Personal history of other malignant neoplasm of skin: Secondary | ICD-10-CM | POA: Diagnosis not present

## 2017-10-23 DIAGNOSIS — Z961 Presence of intraocular lens: Secondary | ICD-10-CM | POA: Diagnosis not present

## 2017-12-03 DIAGNOSIS — L293 Anogenital pruritus, unspecified: Secondary | ICD-10-CM | POA: Diagnosis not present

## 2017-12-24 DIAGNOSIS — R3989 Other symptoms and signs involving the genitourinary system: Secondary | ICD-10-CM | POA: Diagnosis not present

## 2018-03-05 ENCOUNTER — Other Ambulatory Visit (INDEPENDENT_AMBULATORY_CARE_PROVIDER_SITE_OTHER): Payer: Self-pay | Admitting: Internal Medicine

## 2018-03-10 ENCOUNTER — Other Ambulatory Visit (HOSPITAL_COMMUNITY): Payer: Self-pay | Admitting: Internal Medicine

## 2018-03-10 DIAGNOSIS — Z1231 Encounter for screening mammogram for malignant neoplasm of breast: Secondary | ICD-10-CM

## 2018-03-22 ENCOUNTER — Ambulatory Visit (HOSPITAL_COMMUNITY)
Admission: RE | Admit: 2018-03-22 | Discharge: 2018-03-22 | Disposition: A | Discharge status: Home / Self Care | Payer: Medicare Other | Source: Ambulatory Visit | Attending: Internal Medicine | Admitting: Internal Medicine

## 2018-03-22 DIAGNOSIS — Z1231 Encounter for screening mammogram for malignant neoplasm of breast: Secondary | ICD-10-CM | POA: Insufficient documentation

## 2018-07-13 DIAGNOSIS — C50919 Malignant neoplasm of unspecified site of unspecified female breast: Secondary | ICD-10-CM | POA: Diagnosis not present

## 2018-07-13 DIAGNOSIS — K219 Gastro-esophageal reflux disease without esophagitis: Secondary | ICD-10-CM | POA: Diagnosis not present

## 2018-07-13 DIAGNOSIS — M810 Age-related osteoporosis without current pathological fracture: Secondary | ICD-10-CM | POA: Diagnosis not present

## 2018-07-13 DIAGNOSIS — Z79899 Other long term (current) drug therapy: Secondary | ICD-10-CM | POA: Diagnosis not present

## 2018-07-27 ENCOUNTER — Other Ambulatory Visit (HOSPITAL_COMMUNITY): Payer: Self-pay | Admitting: Internal Medicine

## 2018-07-27 DIAGNOSIS — Z0001 Encounter for general adult medical examination with abnormal findings: Secondary | ICD-10-CM | POA: Diagnosis not present

## 2018-07-27 DIAGNOSIS — N183 Chronic kidney disease, stage 3 (moderate): Secondary | ICD-10-CM | POA: Diagnosis not present

## 2018-07-27 DIAGNOSIS — Z853 Personal history of malignant neoplasm of breast: Secondary | ICD-10-CM | POA: Diagnosis not present

## 2018-07-27 DIAGNOSIS — M810 Age-related osteoporosis without current pathological fracture: Secondary | ICD-10-CM | POA: Diagnosis not present

## 2018-07-27 DIAGNOSIS — N959 Unspecified menopausal and perimenopausal disorder: Secondary | ICD-10-CM

## 2018-08-10 ENCOUNTER — Other Ambulatory Visit: Payer: Self-pay

## 2018-08-10 ENCOUNTER — Encounter (INDEPENDENT_AMBULATORY_CARE_PROVIDER_SITE_OTHER): Payer: Self-pay | Admitting: Internal Medicine

## 2018-08-10 ENCOUNTER — Ambulatory Visit (INDEPENDENT_AMBULATORY_CARE_PROVIDER_SITE_OTHER): Payer: Medicare Other | Admitting: Internal Medicine

## 2018-08-10 DIAGNOSIS — K219 Gastro-esophageal reflux disease without esophagitis: Secondary | ICD-10-CM | POA: Diagnosis not present

## 2018-08-10 DIAGNOSIS — K580 Irritable bowel syndrome with diarrhea: Secondary | ICD-10-CM

## 2018-08-10 DIAGNOSIS — K2270 Barrett's esophagus without dysplasia: Secondary | ICD-10-CM | POA: Diagnosis not present

## 2018-08-10 NOTE — Progress Notes (Addendum)
Virtual Visit via Video Note Patient had scheduled yearly visit today which was changed to telephone visit per her request because of ongoing COVID-19pandemic and she asked that I speak with her over the phone and I agreed. I connected with Sonya [name] on 08/10/18 at  4:00 PM EDT by a video enabled telemedicine application and verified that I am speaking with the correct person using two identifiers.   I discussed the limitations of evaluation and management by telemedicine and the availability of in person appointments. The patient expressed understanding and agreed to proceed telephone visit. She is unable to do video visit. Ms Dagostino is at home and I am in the office. Tammy todd, LPN is also present for telephone visit.   History of Present Illness:  Patient is 78 year old Caucasian female who has a history of GERD complicated by short segment Barrett's esophagus as well as history of IBS with diarrhea who was last seen on 06/16/2017.  She has been maintained on pantoprazole with p.m. Pepcid OTC on as-needed basis IBgard and PRN loperamide. Last EGD was in November 2018.  Patient states she is doing very well.  She rarely has heartburn or regurgitation only if she eats spaghetti or drinks red wine.  She denies dysphagia nausea or vomiting.  She says she is a slow eater and chews her food well.  She has lost 4 pounds since her last visit.  She generally has 3 bowel movements per day and most of her stools are now formed.  She has occasional urgency.  She has not had any accident or nocturnal bowel movement.  She also denies melena or rectal bleeding.  She states she had a physical exam by Dr. Willey Blade 2 weeks ago and lab studies are normal except borderline or mildly elevated serum creatinine. She has no concerns about using pantoprazole.  She feels she is not having any side effects.    Current Outpatient Medications:  .  famotidine (PEPCID) 20 MG tablet, Take 1 tablet (20 mg total) by mouth  at bedtime as needed for heartburn or indigestion., Disp: , Rfl:  .  loperamide (IMODIUM) 2 MG capsule, Take 2 mg as needed by mouth for diarrhea or loose stools. , Disp: , Rfl:  .  Multiple Vitamins-Minerals (CENTRUM SILVER PO), Take 1 tablet daily by mouth., Disp: , Rfl:  .  OVER THE COUNTER MEDICATION, Take 1 tablet by mouth 3 (three) times daily as needed. IBgard, Disp: , Rfl:  .  pantoprazole (PROTONIX) 40 MG tablet, TAKE (1) TABLET BY MOUTH ONCE DAILY., Disp: 90 tablet, Rfl: 0 .  raloxifene (EVISTA) 60 MG tablet, Take 60 mg by mouth daily., Disp: , Rfl:  Observations/Objective:  Patient reported her weight to be 147 pounds.  Assessment and Plan:  #1. chronic GERD complicated by short segment Barrett's esophagus.  Last biopsy was in November 2018 and was negative for dysplasia.  Symptom control is satisfactory.  She will continue pantoprazole as before with as needed Pepcid OTC.  #2.  History of IBS with diarrhea and once again she is doing well with therapy.  Follow Up Instructions:  Unless symptoms flare she will return for office visit in 1 year.    I discussed the assessment and treatment plan with the patient. The patient was provided an opportunity to ask questions and all were answered. The patient agreed with the plan and demonstrated an understanding of the instructions.   The patient was advised to call back or seek an in-person evaluation  if the symptoms worsen or if the condition fails to improve as anticipated.  I provided 10 minutes of non-face-to-face time during this encounter.   Hildred Laser, MD

## 2018-09-01 ENCOUNTER — Other Ambulatory Visit (HOSPITAL_COMMUNITY): Payer: Self-pay

## 2018-09-01 ENCOUNTER — Encounter (HOSPITAL_COMMUNITY): Payer: Self-pay

## 2018-09-24 DIAGNOSIS — H903 Sensorineural hearing loss, bilateral: Secondary | ICD-10-CM | POA: Diagnosis not present

## 2018-09-24 DIAGNOSIS — H9313 Tinnitus, bilateral: Secondary | ICD-10-CM | POA: Diagnosis not present

## 2018-10-06 DIAGNOSIS — Z85828 Personal history of other malignant neoplasm of skin: Secondary | ICD-10-CM | POA: Diagnosis not present

## 2018-10-06 DIAGNOSIS — L821 Other seborrheic keratosis: Secondary | ICD-10-CM | POA: Diagnosis not present

## 2018-10-06 DIAGNOSIS — L570 Actinic keratosis: Secondary | ICD-10-CM | POA: Diagnosis not present

## 2018-11-08 DIAGNOSIS — M25552 Pain in left hip: Secondary | ICD-10-CM | POA: Diagnosis not present

## 2018-11-08 DIAGNOSIS — M1612 Unilateral primary osteoarthritis, left hip: Secondary | ICD-10-CM | POA: Diagnosis not present

## 2018-11-08 DIAGNOSIS — M545 Low back pain: Secondary | ICD-10-CM | POA: Diagnosis not present

## 2018-11-08 DIAGNOSIS — S335XXA Sprain of ligaments of lumbar spine, initial encounter: Secondary | ICD-10-CM | POA: Diagnosis not present

## 2018-11-18 DIAGNOSIS — M1612 Unilateral primary osteoarthritis, left hip: Secondary | ICD-10-CM | POA: Diagnosis not present

## 2018-12-01 ENCOUNTER — Ambulatory Visit: Payer: Self-pay

## 2018-12-01 ENCOUNTER — Other Ambulatory Visit: Payer: Self-pay

## 2018-12-01 ENCOUNTER — Ambulatory Visit (INDEPENDENT_AMBULATORY_CARE_PROVIDER_SITE_OTHER): Payer: Medicare Other | Admitting: Orthopaedic Surgery

## 2018-12-01 DIAGNOSIS — M25552 Pain in left hip: Secondary | ICD-10-CM

## 2018-12-01 MED ORDER — LIDOCAINE HCL 1 % IJ SOLN
3.0000 mL | INTRAMUSCULAR | Status: AC | PRN
  Administered 2018-12-01: 17:00:00 3 mL

## 2018-12-01 MED ORDER — BUPIVACAINE HCL 0.5 % IJ SOLN
3.0000 mL | INTRAMUSCULAR | Status: AC | PRN
Start: 2018-12-01 — End: 2018-12-01
  Administered 2018-12-01: 17:00:00 3 mL via INTRA_ARTICULAR

## 2018-12-01 MED ORDER — METHYLPREDNISOLONE ACETATE 40 MG/ML IJ SUSP
40.0000 mg | INTRAMUSCULAR | Status: AC | PRN
Start: 2018-12-01 — End: 2018-12-01
  Administered 2018-12-01: 17:00:00 40 mg via INTRA_ARTICULAR
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
## 2018-12-06 DIAGNOSIS — C50112 Malignant neoplasm of central portion of left female breast: Secondary | ICD-10-CM | POA: Diagnosis not present

## 2018-12-08 ENCOUNTER — Telehealth: Payer: Self-pay | Admitting: Orthopaedic Surgery

## 2018-12-08 NOTE — Telephone Encounter (Signed)
Called and left a Vm advising Leslyn of message below per Dwana Melena, PA.

## 2018-12-08 NOTE — Telephone Encounter (Signed)
Could you please advise on message below.  Thank you.

## 2018-12-08 NOTE — Telephone Encounter (Signed)
Ok to note use it.  Not really an alternative to use

## 2018-12-08 NOTE — Telephone Encounter (Signed)
Patient called stating that she had a reaction to the Voltaren Cream and wanted to know if Dr. Erlinda Hong wanted her to continue to use it or is there an alternative to that cream.  She did not use it today.  CB#[phone].  Thank you.

## 2019-02-07 DIAGNOSIS — L570 Actinic keratosis: Secondary | ICD-10-CM | POA: Diagnosis not present

## 2019-02-07 DIAGNOSIS — L820 Inflamed seborrheic keratosis: Secondary | ICD-10-CM | POA: Diagnosis not present

## 2019-02-07 DIAGNOSIS — Z85828 Personal history of other malignant neoplasm of skin: Secondary | ICD-10-CM | POA: Diagnosis not present

## 2019-02-07 DIAGNOSIS — L821 Other seborrheic keratosis: Secondary | ICD-10-CM | POA: Diagnosis not present

## 2019-02-21 ENCOUNTER — Other Ambulatory Visit (HOSPITAL_COMMUNITY): Payer: Self-pay | Admitting: Internal Medicine

## 2019-02-21 DIAGNOSIS — Z1231 Encounter for screening mammogram for malignant neoplasm of breast: Secondary | ICD-10-CM

## 2019-03-25 ENCOUNTER — Other Ambulatory Visit: Payer: Self-pay

## 2019-03-25 ENCOUNTER — Ambulatory Visit (HOSPITAL_COMMUNITY)
Admission: RE | Admit: 2019-03-25 | Discharge: 2019-03-25 | Disposition: A | Discharge status: Home / Self Care | Payer: Medicare Other | Source: Ambulatory Visit | Attending: Internal Medicine | Admitting: Internal Medicine

## 2019-03-25 DIAGNOSIS — Z1231 Encounter for screening mammogram for malignant neoplasm of breast: Secondary | ICD-10-CM | POA: Diagnosis not present

## 2019-07-08 ENCOUNTER — Other Ambulatory Visit (INDEPENDENT_AMBULATORY_CARE_PROVIDER_SITE_OTHER): Payer: Self-pay | Admitting: Internal Medicine

## 2019-08-01 DIAGNOSIS — K219 Gastro-esophageal reflux disease without esophagitis: Secondary | ICD-10-CM | POA: Diagnosis not present

## 2019-08-01 DIAGNOSIS — M810 Age-related osteoporosis without current pathological fracture: Secondary | ICD-10-CM | POA: Diagnosis not present

## 2019-08-01 DIAGNOSIS — C50912 Malignant neoplasm of unspecified site of left female breast: Secondary | ICD-10-CM | POA: Diagnosis not present

## 2019-08-01 DIAGNOSIS — Z79899 Other long term (current) drug therapy: Secondary | ICD-10-CM | POA: Diagnosis not present

## 2019-08-01 DIAGNOSIS — M1990 Unspecified osteoarthritis, unspecified site: Secondary | ICD-10-CM | POA: Diagnosis not present

## 2019-08-08 DIAGNOSIS — Z0001 Encounter for general adult medical examination with abnormal findings: Secondary | ICD-10-CM | POA: Diagnosis not present

## 2019-08-08 DIAGNOSIS — Z853 Personal history of malignant neoplasm of breast: Secondary | ICD-10-CM | POA: Diagnosis not present

## 2019-08-08 DIAGNOSIS — I10 Essential (primary) hypertension: Secondary | ICD-10-CM | POA: Diagnosis not present

## 2019-08-08 DIAGNOSIS — N1831 Chronic kidney disease, stage 3a: Secondary | ICD-10-CM | POA: Diagnosis not present

## 2019-08-16 ENCOUNTER — Encounter (INDEPENDENT_AMBULATORY_CARE_PROVIDER_SITE_OTHER): Payer: Self-pay | Admitting: Internal Medicine

## 2019-08-16 ENCOUNTER — Ambulatory Visit (INDEPENDENT_AMBULATORY_CARE_PROVIDER_SITE_OTHER): Payer: Medicare Other | Admitting: Internal Medicine

## 2019-08-16 ENCOUNTER — Other Ambulatory Visit: Payer: Self-pay

## 2019-08-16 VITALS — BP 159/80 | HR 81 | Temp 97.7°F | Ht 64.0 in | Wt 150.0 lb

## 2019-08-16 DIAGNOSIS — K219 Gastro-esophageal reflux disease without esophagitis: Secondary | ICD-10-CM | POA: Diagnosis not present

## 2019-08-16 DIAGNOSIS — K2270 Barrett's esophagus without dysplasia: Secondary | ICD-10-CM | POA: Diagnosis not present

## 2019-08-16 DIAGNOSIS — K589 Irritable bowel syndrome without diarrhea: Secondary | ICD-10-CM | POA: Diagnosis not present

## 2019-08-16 MED ORDER — PANTOPRAZOLE SODIUM 40 MG PO TBEC: 40.0000 mg | DELAYED_RELEASE_TABLET | Freq: Every day | ORAL | 3 refills | Status: DC

## 2019-08-16 NOTE — Patient Instructions (Signed)
Notify if you have swallowing difficulty or if pantoprazole stops working.

## 2019-08-16 NOTE — Progress Notes (Signed)
Presenting complaint;  Follow-up for chronic GERD and IBS/diarrhea  Database and subjective:  Patient is 79 year old Caucasian female who is here for scheduled visit.  She was last seen in March 2020 and it was a virtual visit. She has chronic GERD complicated by short segment Barrett's esophagus.  She had a EGD with biopsy in November 2018.  She has been maintained on antireflux measures and pantoprazole with as needed famotidine at bedtime.  She also has history of irritable bowel syndrome with diarrhea and urgency.  She has been maintained on IBgard and as needed loperamide.  She also has history of colonic adenomas but none were found on her last colonoscopy of July 2017.  Patient states she is doing very well.  She has occasional heartburn with certain foods.  She takes famotidine no more than 8 or 10 times in a month when she eats certain food that might cause her to have heartburn or regurgitation.  She denies nausea vomiting dysphagia or throat symptoms.  She is not having any side effects with these medications.  Her bowels move anywhere from 1-4 times per day.  She usually takes IBgard every morning.  She says she may take 1 Imodium in a week or so.  Most of her stools are soft and formed.  She has some urgency.  She denies nocturnal diarrhea.  She also denies melena or rectal bleeding.  Her appetite is good and her weight has been stable. She says she is very active.  She walks regularly.  She also plays golf and does housework.  She notices her blood pressure is elevated today because of stress in the family.  Current Medications: Outpatient Encounter Medications as of 08/16/2019  Medication Sig  . amLODipine (NORVASC) 2.5 MG tablet Take 2.5 mg by mouth daily.  . famotidine (PEPCID) 20 MG tablet Take 1 tablet (20 mg total) by mouth at bedtime as needed for heartburn or indigestion.  Marland Kitchen loperamide (IMODIUM) 2 MG capsule Take 2 mg as needed by mouth for diarrhea or loose stools.   . Multiple  Vitamins-Minerals (CENTRUM SILVER PO) Take 1 tablet daily by mouth.  Marland Kitchen OVER THE COUNTER MEDICATION Take 1 tablet by mouth 3 (three) times daily as needed. IBgard  . pantoprazole (PROTONIX) 40 MG tablet TAKE (1) TABLET BY MOUTH ONCE DAILY.  . raloxifene (EVISTA) 60 MG tablet Take 60 mg by mouth daily.   No facility-administered encounter medications on file as of 08/16/2019.     Objective: Blood pressure (!) 159/80, pulse 81, temperature 97.7 F (36.5 C), temperature source Temporal, height 5\' 4"  (1.626 m), weight 150 lb (68 kg). Patient is alert and in no acute distress. Patient is wearing a facial mask. Conjunctiva is pink. Sclera is nonicteric Oropharyngeal mucosa is normal. No neck masses or thyromegaly noted. Cardiac exam with regular rhythm normal S1 and S2. No murmur or gallop noted. Lungs are clear to auscultation. Abdomen is soft and nontender with no organomegaly or masses. No LE edema or clubbing noted.   Assessment:  #1.  Chronic GERD complicated by short segment Barrett's esophagus.  She is doing well with therapy.  Last EGD was in November 2018 and would consider next exam in November 2023 unless she were to have dysphagia or other symptoms.  #2.  Irritable bowel syndrome with diarrhea.  She is doing well with low-dose IBgard and as needed loperamide.  Last colonoscopy was in July 2017.   Plan:  Continue pantoprazole at current dose of 40 mg by  mouth 30 minutes before breakfast daily.  New prescription sent to patient's pharmacy for 90 doses with 3 refills. She will continue to use famotidine OTC 20 mg nightly as needed. Continue IBgard and Imodium as before. Patient will call office if she develops dysphagia or pantoprazole stops working. Office visit in 1 year.

## 2019-09-08 DIAGNOSIS — I10 Essential (primary) hypertension: Secondary | ICD-10-CM | POA: Diagnosis not present

## 2019-12-22 ENCOUNTER — Ambulatory Visit: Payer: Medicare Other | Admitting: Orthopaedic Surgery

## 2019-12-22 ENCOUNTER — Encounter: Payer: Self-pay | Admitting: Orthopaedic Surgery

## 2019-12-22 DIAGNOSIS — M25552 Pain in left hip: Secondary | ICD-10-CM | POA: Diagnosis not present

## 2019-12-22 MED ORDER — LIDOCAINE HCL 1 % IJ SOLN
3.0000 mL | INTRAMUSCULAR | Status: AC | PRN
Start: 2019-12-22 — End: 2019-12-22
  Administered 2019-12-22: 3 mL

## 2019-12-22 MED ORDER — METHYLPREDNISOLONE ACETATE 40 MG/ML IJ SUSP
40.0000 mg | INTRAMUSCULAR | Status: AC | PRN
  Administered 2019-12-22: 40 mg via INTRA_ARTICULAR

## 2019-12-22 MED ORDER — BUPIVACAINE HCL 0.5 % IJ SOLN
3.0000 mL | INTRAMUSCULAR | Status: AC | PRN
Start: 2019-12-22 — End: 2019-12-22
  Administered 2019-12-22: 3 mL via INTRA_ARTICULAR

## 2019-12-22 NOTE — Progress Notes (Signed)
Office Visit Note   Patient: Sonya [name]           Date of Birth: [birth year]           MRN: [id number] Visit Date: 12/22/2019              Requested by: Asencion Noble, MD [address] [place],  [place] [address] PCP: Asencion Noble, MD   Assessment & Plan: Visit Diagnoses:  1. Pain of left hip joint     Plan: Trochanteric bursal injection repeated today.  Home exercises provided as well.  Follow-up as needed.  Follow-Up Instructions: Return if symptoms worsen or fail to improve.   Orders:  No orders of the defined types were placed in this encounter.  No orders of the defined types were placed in this encounter.     Procedures: Large Joint Inj: L greater trochanter on 12/22/2019 12:04 PM Indications: pain Details: 22 G needle Medications: 3 mL lidocaine 1 %; 3 mL bupivacaine 0.5 %; 40 mg methylPREDNISolone acetate 40 MG/ML Outcome: tolerated well, no immediate complications Patient was prepped and draped in the usual sterile fashion.       Clinical Data: No additional findings.   Subjective: Chief Complaint  Patient presents with   Left Hip - Pain    [name] returns today for recurrent left lateral hip pain.  Previous injection worked for about a year.   Review of Systems   Objective: Vital Signs: There were no vitals taken for this visit.  Physical Exam  Ortho Exam Left hip is point tender to the lateral aspect of the greater trochanter.  No significant groin pain with range of motion of the hip. Specialty Comments:  No specialty comments available.  Imaging: No results found.   PMFS History: Patient Active Problem List   Diagnosis Date Noted   SSBE (short-segment Barrett's esophagus) 08/16/2019   Esophageal dysphagia 03/18/2017   GERD (gastroesophageal reflux disease) 05/31/2013   Dysphagia, unspecified(787.20) 05/31/2013   S/P carpal tunnel release 05/30/2013   Carpal tunnel syndrome, bilateral 03/29/2013   Carpal tunnel  syndrome 02/24/2013   Carpal tunnel syndrome of left wrist 11/24/2012   CMC arthritis, thumb, degenerative 11/24/2012   ADENOCARCINOMA, LEFT BREAST 01/25/2010   IBS 01/25/2010   ARTHRITIS 01/25/2010   PALPITATIONS 01/25/2010   CHEST PAIN 01/25/2010   MASTECTOMY, LEFT, HX OF 01/25/2010   Past Medical History:  Diagnosis Date   Arthritis    Breast cancer ([place])    Cancer ([place])    lt mastectomy-breast   Cancer ([place])    Left Breast   Chronic diarrhea    GERD (gastroesophageal reflux disease)    IBS (irritable bowel syndrome)     Family History  Problem Relation Age of Onset   Colon cancer Mother     Past Surgical History:  Procedure Laterality Date   ABDOMINAL HYSTERECTOMY     BREAST SURGERY     CARPAL TUNNEL RELEASE  2015   left   CARPAL TUNNEL RELEASE Right 07/19/2014   Procedure: RIGHT CARPAL TUNNEL RELEASE AND TENOSYNOVECTOMY;  Surgeon: Leandrew Koyanagi, MD;  Location: [facility];  Service: Orthopedics;  Laterality: Right;   CARPAL TUNNEL RELEASE Left 05/20/2013   Procedure: CARPAL TUNNEL RELEASE;  Surgeon: Carole Civil, MD;  [facility];  Service: Orthopedics;  Laterality: Left;   COLONOSCOPY     COLONOSCOPY N/A 11/21/2015   Procedure: COLONOSCOPY;  Surgeon: Rogene Houston, MD;  [facility];  Service:  Endoscopy;  Laterality: N/A;  1200   ESOPHAGEAL DILATION N/A 03/23/2017   Procedure: ESOPHAGEAL DILATION;  Surgeon: Rogene Houston, MD;  [facility];  Service: Endoscopy;  Laterality: N/A;   ESOPHAGOGASTRODUODENOSCOPY N/A 03/23/2017   Procedure: ESOPHAGOGASTRODUODENOSCOPY (EGD);  Surgeon: Rogene Houston, MD;  [facility];  Service: Endoscopy;  Laterality: N/A;  135   MASTECTOMY  1990   lt mast   MELANOMA EXCISION     leg   TENOSYNOVECTOMY Right 07/19/2014   Procedure: TENOSYNOVECTOMY;  Surgeon: Leandrew Koyanagi, MD;  Location: [facility];  Service: Orthopedics;  Laterality:  Right;   TONSILLECTOMY     UPPER GI ENDOSCOPY     Social History   Occupational History   Not on file  Tobacco Use   Smoking status: Former Smoker    Packs/day: 0.50    Years: 10.00    Pack years: 5.00    Types: Cigarettes    Quit date: 05/12/1987    Years since quitting: 32.6   Smokeless tobacco: Never Used  Vaping Use   Vaping Use: Never used  Substance and Sexual Activity   Alcohol use: Yes    Alcohol/week: 2.0 standard drinks    Types: 1 Glasses of wine, 1 Shots of liquor per week    Comment: Daily   Drug use: No   Sexual activity: Yes

## 2019-12-26 DIAGNOSIS — Z79899 Other long term (current) drug therapy: Secondary | ICD-10-CM | POA: Diagnosis not present

## 2019-12-26 DIAGNOSIS — I10 Essential (primary) hypertension: Secondary | ICD-10-CM | POA: Diagnosis not present

## 2020-01-03 DIAGNOSIS — I10 Essential (primary) hypertension: Secondary | ICD-10-CM | POA: Diagnosis not present

## 2020-01-03 DIAGNOSIS — N1831 Chronic kidney disease, stage 3a: Secondary | ICD-10-CM | POA: Diagnosis not present

## 2020-02-07 DIAGNOSIS — L821 Other seborrheic keratosis: Secondary | ICD-10-CM | POA: Diagnosis not present

## 2020-02-07 DIAGNOSIS — L570 Actinic keratosis: Secondary | ICD-10-CM | POA: Diagnosis not present

## 2020-02-07 DIAGNOSIS — Z85828 Personal history of other malignant neoplasm of skin: Secondary | ICD-10-CM | POA: Diagnosis not present

## 2020-02-07 DIAGNOSIS — D225 Melanocytic nevi of trunk: Secondary | ICD-10-CM | POA: Diagnosis not present

## 2020-02-20 ENCOUNTER — Other Ambulatory Visit (HOSPITAL_COMMUNITY): Payer: Self-pay | Admitting: Internal Medicine

## 2020-02-20 DIAGNOSIS — Z1231 Encounter for screening mammogram for malignant neoplasm of breast: Secondary | ICD-10-CM

## 2020-02-21 DIAGNOSIS — K146 Glossodynia: Secondary | ICD-10-CM | POA: Diagnosis not present

## 2020-02-21 DIAGNOSIS — J309 Allergic rhinitis, unspecified: Secondary | ICD-10-CM | POA: Diagnosis not present

## 2020-03-12 DIAGNOSIS — K1230 Oral mucositis (ulcerative), unspecified: Secondary | ICD-10-CM | POA: Diagnosis not present

## 2020-03-12 DIAGNOSIS — R070 Pain in throat: Secondary | ICD-10-CM | POA: Diagnosis not present

## 2020-03-12 DIAGNOSIS — H903 Sensorineural hearing loss, bilateral: Secondary | ICD-10-CM | POA: Diagnosis not present

## 2020-03-22 ENCOUNTER — Ambulatory Visit: Payer: Medicare Other | Attending: Internal Medicine

## 2020-03-22 DIAGNOSIS — Z23 Encounter for immunization: Secondary | ICD-10-CM

## 2020-03-22 NOTE — Progress Notes (Signed)
   Covid-19 Vaccination [facility]  Name:  Sonya [name]    MRN: [id number] DOB: [birth year]  03/22/2020  Sonya [name] was observed post Covid-19 immunization for 15 minutes without incident. She was provided with Vaccine Information Sheet and instruction to access the V-Safe system.   Sonya [name] was instructed to call 911 with any severe reactions post vaccine: Marland Kitchen Difficulty breathing  . Swelling of face and throat  . A fast heartbeat  . A bad rash all over body  . Dizziness and weakness

## 2020-03-28 ENCOUNTER — Ambulatory Visit (HOSPITAL_COMMUNITY): Payer: Medicare Other

## 2020-04-12 DIAGNOSIS — Z23 Encounter for immunization: Secondary | ICD-10-CM | POA: Diagnosis not present

## 2020-04-12 DIAGNOSIS — R0982 Postnasal drip: Secondary | ICD-10-CM | POA: Diagnosis not present

## 2020-04-12 DIAGNOSIS — I10 Essential (primary) hypertension: Secondary | ICD-10-CM | POA: Diagnosis not present

## 2020-05-07 ENCOUNTER — Other Ambulatory Visit: Payer: Self-pay

## 2020-05-07 ENCOUNTER — Ambulatory Visit (HOSPITAL_COMMUNITY)
Admission: RE | Admit: 2020-05-07 | Discharge: 2020-05-07 | Disposition: A | Discharge status: Home / Self Care | Payer: Medicare Other | Source: Ambulatory Visit | Attending: Internal Medicine | Admitting: Internal Medicine

## 2020-05-07 DIAGNOSIS — Z1231 Encounter for screening mammogram for malignant neoplasm of breast: Secondary | ICD-10-CM | POA: Diagnosis not present

## 2020-06-18 ENCOUNTER — Telehealth: Payer: Self-pay

## 2020-06-18 NOTE — Telephone Encounter (Signed)
Sure just have her come in wed or thurs morning.  Thanks.

## 2020-06-18 NOTE — Telephone Encounter (Signed)
Appt made sooner

## 2020-06-18 NOTE — Telephone Encounter (Signed)
Patient would like to be worked in this week. You are booked. Made her appt for next week for now.  Let me know if you would like to see her sooner- Left hip pain

## 2020-06-21 ENCOUNTER — Ambulatory Visit: Payer: Self-pay

## 2020-06-21 ENCOUNTER — Ambulatory Visit: Payer: Medicare Other | Admitting: Orthopaedic Surgery

## 2020-06-21 ENCOUNTER — Encounter: Payer: Self-pay | Admitting: Orthopaedic Surgery

## 2020-06-21 VITALS — Ht 64.0 in | Wt 145.0 lb

## 2020-06-21 DIAGNOSIS — M25552 Pain in left hip: Secondary | ICD-10-CM | POA: Diagnosis not present

## 2020-06-21 MED ORDER — METHYLPREDNISOLONE ACETATE 40 MG/ML IJ SUSP
40.0000 mg | INTRAMUSCULAR | Status: AC | PRN
  Administered 2020-06-21: 40 mg via INTRA_ARTICULAR

## 2020-06-21 MED ORDER — LIDOCAINE HCL 1 % IJ SOLN
3.0000 mL | INTRAMUSCULAR | Status: AC | PRN
  Administered 2020-06-21: 3 mL

## 2020-06-21 MED ORDER — BUPIVACAINE HCL 0.5 % IJ SOLN
3.0000 mL | INTRAMUSCULAR | Status: AC | PRN
  Administered 2020-06-21: 3 mL via INTRA_ARTICULAR

## 2020-06-21 NOTE — Progress Notes (Signed)
Office Visit Note   Patient: Sonya [name]           Date of Birth: 1941-02-06           MRN: [id number] Visit Date: 06/21/2020              Requested by: Asencion Noble, MD [address] [place],  [place] [address] PCP: Asencion Noble, MD   Assessment & Plan: Visit Diagnoses:  1. Pain in left hip     Plan: Impression is left hip pain that I feel is mainly due to hip abductor tendinosis/bursitis and to a lesser extent hip arthritis.  We agreed to start with repeating the trochanteric injection today to see what kind of relief she gets.  She is able to walk down the hallway without pain immediately after the trochanteric injection.  She will get back in touch with [place] if she needs anything.  Follow-Up Instructions: Return if symptoms worsen or fail to improve.   Orders:  Orders Placed This Encounter  Procedures  . XR HIP UNILAT W OR W/O PELVIS 2-3 VIEWS LEFT   No orders of the defined types were placed in this encounter.     Procedures: Large Joint Inj: L greater trochanter on 06/21/2020 8:40 AM Indications: pain Details: 22 G needle Medications: 3 mL lidocaine 1 %; 3 mL bupivacaine 0.5 %; 40 mg methylPREDNISolone acetate 40 MG/ML Outcome: tolerated well, no immediate complications Patient was prepped and draped in the usual sterile fashion.       Clinical Data: No additional findings.   Subjective: Chief Complaint  Patient presents with  . Left Hip - Pain    Sonya [name] returns today for recurrent left hip pain for about 5 weeks.  Denies any injuries.  We did a trochanteric injection in August of last year which helped.  The prior 1 before that helped for about 2 years.  She endorses some mild growing discomfort.  Denies any numbness and tingling or radicular symptoms or back pain.   Review of Systems   Objective: Vital Signs: Ht 5\' 4"  (1.626 m)   Wt 145 lb (65.8 kg)   BMI 24.89 kg/m   Physical Exam  Ortho Exam Left hip shows well-preserved range of  motion.  Positive FADIR.  Negative Stinchfield.  Negative sciatic tension.  She is tender to the posterior lateral aspect of the greater trochanter.  No significant pain with resisted hip abduction.  Equivocal Ober sign.  Specialty Comments:  No specialty comments available.  Imaging: XR HIP UNILAT W OR W/O PELVIS 2-3 VIEWS LEFT  Result Date: 06/21/2020 Moderate bilateral hip arthrosis.  Irregularity and calcifications of the tip of the greater trochanter of the left hip    PMFS History: Patient Active Problem List   Diagnosis Date Noted  . SSBE (short-segment Barrett's esophagus) 08/16/2019  . Esophageal dysphagia 03/18/2017  . GERD (gastroesophageal reflux disease) 05/31/2013  . Dysphagia, unspecified(787.20) 05/31/2013  . S/P carpal tunnel release 05/30/2013  . Carpal tunnel syndrome, bilateral 03/29/2013  . Carpal tunnel syndrome 02/24/2013  . Carpal tunnel syndrome of left wrist 11/24/2012  . CMC arthritis, thumb, degenerative 11/24/2012  . ADENOCARCINOMA, LEFT BREAST 01/25/2010  . IBS 01/25/2010  . ARTHRITIS 01/25/2010  . PALPITATIONS 01/25/2010  . CHEST PAIN 01/25/2010  . MASTECTOMY, LEFT, HX OF 01/25/2010   Past Medical History:  Diagnosis Date  . Arthritis   . Breast cancer ([place])   . Cancer ([place])    lt mastectomy-breast  . Cancer ([place])  Left Breast  . Chronic diarrhea   . GERD (gastroesophageal reflux disease)   . IBS (irritable bowel syndrome)     Family History  Problem Relation Age of Onset  . Colon cancer Mother     Past Surgical History:  Procedure Laterality Date  . ABDOMINAL HYSTERECTOMY    . BREAST SURGERY    . CARPAL TUNNEL RELEASE  2015   left  . CARPAL TUNNEL RELEASE Right 07/19/2014   Procedure: RIGHT CARPAL TUNNEL RELEASE AND TENOSYNOVECTOMY;  Surgeon: Leandrew Koyanagi, MD;  Location: [facility];  Service: Orthopedics;  Laterality: Right;  . CARPAL TUNNEL RELEASE Left 05/20/2013   Procedure: CARPAL TUNNEL RELEASE;  Surgeon:  Carole Civil, MD;  [facility];  Service: Orthopedics;  Laterality: Left;  . COLONOSCOPY    . COLONOSCOPY N/A 11/21/2015   Procedure: COLONOSCOPY;  Surgeon: Rogene Houston, MD;  [facility];  Service: Endoscopy;  Laterality: N/A;  1200  . ESOPHAGEAL DILATION N/A 03/23/2017   Procedure: ESOPHAGEAL DILATION;  Surgeon: Rogene Houston, MD;  [facility];  Service: Endoscopy;  Laterality: N/A;  . ESOPHAGOGASTRODUODENOSCOPY N/A 03/23/2017   Procedure: ESOPHAGOGASTRODUODENOSCOPY (EGD);  Surgeon: Rogene Houston, MD;  [facility];  Service: Endoscopy;  Laterality: N/A;  135  . MASTECTOMY  1990   lt mast  . MELANOMA EXCISION     leg  . TENOSYNOVECTOMY Right 07/19/2014   Procedure: TENOSYNOVECTOMY;  Surgeon: Leandrew Koyanagi, MD;  Location: [facility];  Service: Orthopedics;  Laterality: Right;  . TONSILLECTOMY    . UPPER GI ENDOSCOPY     Social History   Occupational History  . Not on file  Tobacco Use  . Smoking status: Former Smoker    Packs/day: 0.50    Years: 10.00    Pack years: 5.00    Types: Cigarettes    Quit date: 05/12/1987    Years since quitting: 33.1  . Smokeless tobacco: Never Used  Vaping Use  . Vaping Use: Never used  Substance and Sexual Activity  . Alcohol use: Yes    Alcohol/week: 2.0 standard drinks    Types: 1 Glasses of wine, 1 Shots of liquor per week    Comment: Daily  . Drug use: No  . Sexual activity: Yes

## 2020-06-26 ENCOUNTER — Ambulatory Visit: Payer: Medicare Other | Admitting: Orthopaedic Surgery

## 2020-08-02 DIAGNOSIS — N183 Chronic kidney disease, stage 3 unspecified: Secondary | ICD-10-CM | POA: Diagnosis not present

## 2020-08-02 DIAGNOSIS — M810 Age-related osteoporosis without current pathological fracture: Secondary | ICD-10-CM | POA: Diagnosis not present

## 2020-08-02 DIAGNOSIS — I10 Essential (primary) hypertension: Secondary | ICD-10-CM | POA: Diagnosis not present

## 2020-08-02 DIAGNOSIS — M1990 Unspecified osteoarthritis, unspecified site: Secondary | ICD-10-CM | POA: Diagnosis not present

## 2020-08-02 DIAGNOSIS — Z853 Personal history of malignant neoplasm of breast: Secondary | ICD-10-CM | POA: Diagnosis not present

## 2020-08-09 ENCOUNTER — Other Ambulatory Visit (HOSPITAL_COMMUNITY): Payer: Self-pay | Admitting: Internal Medicine

## 2020-08-09 DIAGNOSIS — N1831 Chronic kidney disease, stage 3a: Secondary | ICD-10-CM | POA: Diagnosis not present

## 2020-08-09 DIAGNOSIS — Z0000 Encounter for general adult medical examination without abnormal findings: Secondary | ICD-10-CM | POA: Diagnosis not present

## 2020-08-09 DIAGNOSIS — I10 Essential (primary) hypertension: Secondary | ICD-10-CM | POA: Diagnosis not present

## 2020-08-09 DIAGNOSIS — M810 Age-related osteoporosis without current pathological fracture: Secondary | ICD-10-CM | POA: Diagnosis not present

## 2020-08-09 DIAGNOSIS — N950 Postmenopausal bleeding: Secondary | ICD-10-CM

## 2020-08-09 DIAGNOSIS — Z853 Personal history of malignant neoplasm of breast: Secondary | ICD-10-CM | POA: Diagnosis not present

## 2020-08-17 ENCOUNTER — Other Ambulatory Visit: Payer: Self-pay

## 2020-08-17 ENCOUNTER — Ambulatory Visit (HOSPITAL_COMMUNITY)
Admission: RE | Admit: 2020-08-17 | Discharge: 2020-08-17 | Disposition: A | Discharge status: Home / Self Care | Payer: Medicare Other | Source: Ambulatory Visit | Attending: Internal Medicine | Admitting: Internal Medicine

## 2020-08-17 DIAGNOSIS — M85851 Other specified disorders of bone density and structure, right thigh: Secondary | ICD-10-CM | POA: Diagnosis not present

## 2020-08-17 DIAGNOSIS — Z13820 Encounter for screening for osteoporosis: Secondary | ICD-10-CM | POA: Diagnosis not present

## 2020-08-17 DIAGNOSIS — M810 Age-related osteoporosis without current pathological fracture: Secondary | ICD-10-CM | POA: Insufficient documentation

## 2020-08-17 DIAGNOSIS — Z780 Asymptomatic menopausal state: Secondary | ICD-10-CM | POA: Diagnosis not present

## 2020-08-17 DIAGNOSIS — N950 Postmenopausal bleeding: Secondary | ICD-10-CM | POA: Diagnosis not present

## 2020-08-21 ENCOUNTER — Ambulatory Visit (INDEPENDENT_AMBULATORY_CARE_PROVIDER_SITE_OTHER): Payer: Medicare Other | Admitting: Internal Medicine

## 2020-08-30 ENCOUNTER — Other Ambulatory Visit (INDEPENDENT_AMBULATORY_CARE_PROVIDER_SITE_OTHER): Payer: Self-pay

## 2020-08-30 DIAGNOSIS — K219 Gastro-esophageal reflux disease without esophagitis: Secondary | ICD-10-CM

## 2020-08-30 MED ORDER — PANTOPRAZOLE SODIUM 40 MG PO TBEC: 40.0000 mg | DELAYED_RELEASE_TABLET | Freq: Every day | ORAL | 3 refills | Status: DC

## 2020-10-30 DIAGNOSIS — Z85828 Personal history of other malignant neoplasm of skin: Secondary | ICD-10-CM | POA: Diagnosis not present

## 2020-10-30 DIAGNOSIS — L820 Inflamed seborrheic keratosis: Secondary | ICD-10-CM | POA: Diagnosis not present

## 2020-10-30 DIAGNOSIS — L821 Other seborrheic keratosis: Secondary | ICD-10-CM | POA: Diagnosis not present

## 2020-11-08 DIAGNOSIS — M810 Age-related osteoporosis without current pathological fracture: Secondary | ICD-10-CM | POA: Diagnosis not present

## 2020-11-08 DIAGNOSIS — C50919 Malignant neoplasm of unspecified site of unspecified female breast: Secondary | ICD-10-CM | POA: Diagnosis not present

## 2020-11-26 DIAGNOSIS — C50912 Malignant neoplasm of unspecified site of left female breast: Secondary | ICD-10-CM | POA: Diagnosis not present

## 2020-11-26 DIAGNOSIS — N1831 Chronic kidney disease, stage 3a: Secondary | ICD-10-CM | POA: Diagnosis not present

## 2020-11-26 DIAGNOSIS — Z79899 Other long term (current) drug therapy: Secondary | ICD-10-CM | POA: Diagnosis not present

## 2020-11-26 DIAGNOSIS — K219 Gastro-esophageal reflux disease without esophagitis: Secondary | ICD-10-CM | POA: Diagnosis not present

## 2020-11-26 DIAGNOSIS — I10 Essential (primary) hypertension: Secondary | ICD-10-CM | POA: Diagnosis not present

## 2020-12-09 DIAGNOSIS — C50919 Malignant neoplasm of unspecified site of unspecified female breast: Secondary | ICD-10-CM | POA: Diagnosis not present

## 2020-12-09 DIAGNOSIS — M810 Age-related osteoporosis without current pathological fracture: Secondary | ICD-10-CM | POA: Diagnosis not present

## 2020-12-10 DIAGNOSIS — I10 Essential (primary) hypertension: Secondary | ICD-10-CM | POA: Diagnosis not present

## 2020-12-10 DIAGNOSIS — N183 Chronic kidney disease, stage 3 unspecified: Secondary | ICD-10-CM | POA: Diagnosis not present

## 2020-12-11 ENCOUNTER — Ambulatory Visit (INDEPENDENT_AMBULATORY_CARE_PROVIDER_SITE_OTHER): Payer: Medicare Other | Admitting: Gastroenterology

## 2020-12-11 ENCOUNTER — Ambulatory Visit (INDEPENDENT_AMBULATORY_CARE_PROVIDER_SITE_OTHER): Payer: Medicare Other | Admitting: Internal Medicine

## 2020-12-11 ENCOUNTER — Other Ambulatory Visit: Payer: Self-pay

## 2020-12-11 ENCOUNTER — Encounter (INDEPENDENT_AMBULATORY_CARE_PROVIDER_SITE_OTHER): Payer: Self-pay | Admitting: Internal Medicine

## 2020-12-11 VITALS — BP 126/74 | HR 82 | Temp 98.2°F | Ht 64.0 in | Wt 146.0 lb

## 2020-12-11 DIAGNOSIS — K2270 Barrett's esophagus without dysplasia: Secondary | ICD-10-CM | POA: Diagnosis not present

## 2020-12-11 DIAGNOSIS — K580 Irritable bowel syndrome with diarrhea: Secondary | ICD-10-CM

## 2020-12-11 DIAGNOSIS — K219 Gastro-esophageal reflux disease without esophagitis: Secondary | ICD-10-CM

## 2020-12-11 NOTE — Progress Notes (Signed)
Referring Provider: Asencion Noble, MD Primary Care Physician:  Asencion Noble, MD Primary GI Physician: Dr. Laural Golden   Chief Complaint  Patient presents with   Follow-up    One yr follow up on IBS. Pt states no concerns. Has 4 -5 stools a day. Appetite is good.    HPI:   Sonya [name] is a 80 y.o. female presenting today with a history of arthritis, breast cancer, IBS-D, GERD, colonic adenoma, and short segment Barrett's esophagus. She presents today for follow up of IBS-D and GERD.  GERD/Short Segment Barrett's Esophagus: Protonix '40mg'$  daily in the morning, doing well on this, rarely has breakthrough symptoms of reflux. Takes famotidine PRN, but states she is unsure of the last time she needed this. Reports recent dexa scan that was WNL.   IBS-D:  states that she has certain triggers that she tries to avoid. Stools are frequent but not diarrhea. 4-5 BMs per day, soft but formed. Was previously using IBGARD per last office visit note, however, she reports she is not sure if she is still using this or not, states that she takes loperamide once weekly with good result. Denies fecal incontinence. Denies abdominal pain or cramping.   No red flag symptoms. Patient denies melena, hematochezia, nausea, vomiting, diarrhea, constipation, dysphagia, odyonophagia, early satiety or weight loss.   Last Colonoscopy:(11/21/15) Diverticulosis in the sigmoid colon, in the descending colon, at the splenic flexure and at the hepatic flexure.External hemorrhoids. No specimens collected.  Last Endoscopy:(03/23/17 )Normal proximal esophagus and mid esophagus. Esophageal mucosal changes (confirmed Barrett's Esophagus). Moderate narrowing due to Schatzki ring. Dilated. Ring disrupted further with focal biopsy but no tissue saved. 2 cm hiatal hernia. Normal stomach. Normal duodenal bulb and second portion of the duodenum.  Familial GI history: Mother had colon cancer  Recommendations:  Surveillance EGD in Nov  2023 Repeat surveillance Colonoscopy in 2022 (within the next few months)  Past Medical History:  Diagnosis Date   Arthritis    Breast cancer ([place])    Cancer ([place])    lt mastectomy-breast   Cancer ([place])    Left Breast   Chronic diarrhea    GERD (gastroesophageal reflux disease)    IBS (irritable bowel syndrome)     Past Surgical History:  Procedure Laterality Date   ABDOMINAL HYSTERECTOMY     BREAST SURGERY     CARPAL TUNNEL RELEASE  2015   left   CARPAL TUNNEL RELEASE Right 07/19/2014   Procedure: RIGHT CARPAL TUNNEL RELEASE AND TENOSYNOVECTOMY;  Surgeon: Leandrew Koyanagi, MD;  Location: [facility];  Service: Orthopedics;  Laterality: Right;   CARPAL TUNNEL RELEASE Left 05/20/2013   Procedure: CARPAL TUNNEL RELEASE;  Surgeon: Carole Civil, MD;  [facility];  Service: Orthopedics;  Laterality: Left;   COLONOSCOPY     COLONOSCOPY N/A 11/21/2015   Procedure: COLONOSCOPY;  Surgeon: Rogene Houston, MD;  [facility];  Service: Endoscopy;  Laterality: N/A;  1200   ESOPHAGEAL DILATION N/A 03/23/2017   Procedure: ESOPHAGEAL DILATION;  Surgeon: Rogene Houston, MD;  [facility];  Service: Endoscopy;  Laterality: N/A;   ESOPHAGOGASTRODUODENOSCOPY N/A 03/23/2017   Procedure: ESOPHAGOGASTRODUODENOSCOPY (EGD);  Surgeon: Rogene Houston, MD;  [facility];  Service: Endoscopy;  Laterality: N/A;  135   MASTECTOMY  1990   lt mast   MELANOMA EXCISION     leg   TENOSYNOVECTOMY Right 07/19/2014   Procedure: TENOSYNOVECTOMY;  Surgeon: Leandrew Koyanagi, MD;  Location: [facility];  Service: Orthopedics;  Laterality: Right;   TONSILLECTOMY     UPPER GI ENDOSCOPY      Current Outpatient Medications  Medication Sig Dispense Refill   amLODipine (NORVASC) 2.5 MG tablet Take 2.5 mg by mouth daily.     hydrochlorothiazide (MICROZIDE) 12.5 MG capsule Take 12.5 mg by mouth daily.     Multiple Vitamins-Minerals (CENTRUM SILVER PO) Take 1  tablet daily by mouth.     pantoprazole (PROTONIX) 40 MG tablet Take 1 tablet (40 mg total) by mouth daily before breakfast. 90 tablet 3   raloxifene (EVISTA) 60 MG tablet Take 60 mg by mouth daily.     valACYclovir (VALTREX) 1000 MG tablet Take 1,000 mg by mouth.     No current facility-administered medications for this visit.    Allergies as of 12/11/2020 - Review Complete 12/11/2020  Allergen Reaction Noted   Celebrex [celecoxib] Shortness Of Breath, Swelling, and Rash     Family History  Problem Relation Age of Onset   Colon cancer Mother     Social History   Socioeconomic History   Marital status: Married    Spouse name: Not on file   Number of children: Not on file   Years of education: Not on file   Highest education level: Not on file  Occupational History   Not on file  Tobacco Use   Smoking status: Former    Packs/day: 0.50    Years: 10.00    Pack years: 5.00    Types: Cigarettes    Quit date: 05/12/1987    Years since quitting: 33.6   Smokeless tobacco: Never  Vaping Use   Vaping Use: Never used  Substance and Sexual Activity   Alcohol use: Yes    Alcohol/week: 2.0 standard drinks    Types: 1 Glasses of wine, 1 Shots of liquor per week    Comment: Daily   Drug use: No   Sexual activity: Yes  Other Topics Concern   Not on file  Social History Narrative   ** Merged History Encounter **       Social Determinants of Health   Financial Resource Strain: Not on file  Food Insecurity: Not on file  Transportation Needs: Not on file  Physical Activity: Not on file  Stress: Not on file  Social Connections: Not on file    Review of Systems: Gen: Denies fever, chills, anorexia. Denies fatigue, weakness, weight loss.  CV: Denies chest pain, palpitations, syncope, peripheral edema, and claudication. Resp: Denies dyspnea at rest, cough, wheezing, coughing up blood, and pleurisy. GI: Denies vomiting blood, jaundice, and fecal incontinence. Denies dysphagia  or odynophagia. Derm: Denies rash, itching, dry skin Psych: Denies depression, anxiety, memory loss, confusion. No homicidal or suicidal ideation.  Heme: Denies bruising, bleeding, and enlarged lymph nodes.  Physical Exam: BP 126/74 (BP Location: Right Arm, Patient Position: Sitting, Cuff Size: Large)   Pulse 82   Temp 98.2 F (36.8 C) (Oral)   Ht '5\' 4"'$  (1.626 m)   Wt 146 lb (66.2 kg)   BMI 25.06 kg/m  General:   Alert and oriented. No distress noted. Pleasant and cooperative.  Head:  Normocephalic and atraumatic. Eyes:  Conjuctiva clear without scleral icterus. Mouth:  Oral mucosa pink and moist. Good dentition. No lesions. Heart: Normal rate and rhythm, s1 and s2 heart sounds present.  Lungs: Clear lung sounds in all lobes. Respirations equal and unlabored. Abdomen:  +BS, soft, non-tender and non-distended. No rebound or  guarding. No HSM or masses noted. Derm: No palmar erythema or jaundice Msk:  Symmetrical without gross deformities. Normal posture. Extremities:  Without edema. Neurologic:  Alert and  oriented x4 Psych:  Alert and cooperative. Normal mood and affect.  ASSESSMENT: Sonya [name] is a 80 y.o. female presenting today for follow up of IBS-D and GERD/barrett's esophagus.  Patient is doing well on Protonix '40mg'$  once daily. She reports that she rarely has any breakthrough reflux symptoms, but takes famotidine prn when she does. She is unsure of the last time she needed to take famotidine as it is so infrequently needed. We will continue with this current regimen. She will be due for surveillance EGD r/t hx of Barrett's esophagus in Nov 2023.   IBS-D well managed at this time. Patient able to recognize triggers such as stress that increase her symptoms. She is using loperamide only as needed, maybe once per week. She denies diarrhea, but reports 5-6, soft, but formed stools per day. No abdominal pain or cramping. No fecal incontinence reported. Patient advised that she  can try taking loperamide more often, up to QID to help try and decrease BMs, however, if she is not having diarrhea, this could cause her some constipation. If frequency of BMs does not improve, she should call us and we can reevaluate possibility of using bentyl or levsin, however, given patient's age, it would be best to avoid use of anticholinergics if possible.   She admits to occasional bloating/gas which she takes gas-x for with good result. Discussed utilization of IBGARD or plant enzymes to help with gas or bloating as well as avoiding foods such as lettuce, cabbage, broccoli, etc that tend to cause more gas.   PLAN:  Continue protonix '40mg'$  once daily, you can use famotidine prn for breakthrough reflux 2.can take loperamide for very frequent BMs up to QID, can reevaluate need for bentyl/levsin if loperamide does not decrease BM frequency 3. Continue gas-x, ibgard or plant enzymes to help with gas/bloating 4. Due for colonoscopy, please schedule as soon as you are able  Follow Up: 1 year  Case discussed with Dr. Laural Golden who is in agreement with plan of care, as outlined above.   Aparna Vanderweele L. Alver Sorrow, MSN, APRN, AGNP-C Adult-Gerontology Nurse Practitioner [facility]

## 2020-12-11 NOTE — Patient Instructions (Addendum)
-  Continue you protonix '40mg'$  once daily. You may use famotidine as needed for reflux symptoms. -Try taking loperamide (immodium) as needed to help with frequent bowel movements, you may take this up to four times per day. Please let us know if this does not help decrease such frequent bowel movements -you can try over the counter plant enzymes (amazon), IBGARD (peppermint oil) or continue gas-x as needed for gas.   -We will plan for colonoscopy in the next couple of months.  Follow up in 1 year  It was a pleasure caring for you today!

## 2020-12-13 ENCOUNTER — Encounter (INDEPENDENT_AMBULATORY_CARE_PROVIDER_SITE_OTHER): Payer: Self-pay

## 2021-02-20 ENCOUNTER — Other Ambulatory Visit (INDEPENDENT_AMBULATORY_CARE_PROVIDER_SITE_OTHER): Payer: Self-pay

## 2021-02-20 ENCOUNTER — Telehealth (INDEPENDENT_AMBULATORY_CARE_PROVIDER_SITE_OTHER): Payer: Self-pay

## 2021-02-20 ENCOUNTER — Encounter (INDEPENDENT_AMBULATORY_CARE_PROVIDER_SITE_OTHER): Payer: Self-pay

## 2021-02-20 DIAGNOSIS — Z86010 Personal history of colonic polyps: Secondary | ICD-10-CM

## 2021-02-20 DIAGNOSIS — K2270 Barrett's esophagus without dysplasia: Secondary | ICD-10-CM

## 2021-02-20 MED ORDER — PEG 3350-KCL-NA BICARB-NACL 420 G PO SOLR: 4000.0000 mL | ORAL | 0 refills | Status: DC

## 2021-02-20 NOTE — Telephone Encounter (Signed)
LeighAnn Renelda Kilian, CMA  

## 2021-02-25 DIAGNOSIS — Z85828 Personal history of other malignant neoplasm of skin: Secondary | ICD-10-CM | POA: Diagnosis not present

## 2021-02-25 DIAGNOSIS — L821 Other seborrheic keratosis: Secondary | ICD-10-CM | POA: Diagnosis not present

## 2021-02-25 DIAGNOSIS — L570 Actinic keratosis: Secondary | ICD-10-CM | POA: Diagnosis not present

## 2021-03-19 ENCOUNTER — Encounter (INDEPENDENT_AMBULATORY_CARE_PROVIDER_SITE_OTHER): Payer: Self-pay | Admitting: Gastroenterology

## 2021-03-19 ENCOUNTER — Ambulatory Visit (INDEPENDENT_AMBULATORY_CARE_PROVIDER_SITE_OTHER): Payer: Medicare Other | Admitting: Gastroenterology

## 2021-03-19 ENCOUNTER — Other Ambulatory Visit: Payer: Self-pay

## 2021-03-19 VITALS — BP 148/79 | HR 82 | Temp 98.4°F | Ht 64.0 in | Wt 147.9 lb

## 2021-03-19 DIAGNOSIS — K580 Irritable bowel syndrome with diarrhea: Secondary | ICD-10-CM

## 2021-03-19 NOTE — Patient Instructions (Addendum)
Patient was counseled about the benefit of avoiding stressing situations and potential environmental triggers leading to symptomatology. Start IBGard 1 tablet every 8-12 hours as needed for bloating and abdominal discomfort Proceed with schedule EGD and colonoscopy Can take Imodium as needed if recurrent diarrhea.

## 2021-03-19 NOTE — Progress Notes (Signed)
Maylon Peppers, M.D. Gastroenterology & Hepatology [facility] [address] [place], [place] [address]  Primary Care Physician: Asencion Noble, MD [address] [place] [address]  I will communicate my assessment and recommendations to the referring MD via EMR.  Problems: IBS-D GERD c/b Barrett's esophagus  History of Present Illness: CHONDA BANEY is a 80 y.o. female with past medical history of IBS-D, breast cancer status postmastectomy and chemoradiation, GERD c/b Barrett's esophagus and arthritis, who presents for follow up of IBS.  The patient was last seen on 12/11/2020. At that time, the patient was advised to continue taking Protonix 40 mg every day and use famotidine as needed for breakthrough episodes.  She was also advised to take loperamide for episodes of diarrhea and Gas-X for bloating.  She was scheduled for colonoscopy which she will have on 03/27/2021.  Patient reports that on Tuesday she had multiple episodes of watery diarrhea ([place] 7), up to 5 Bms. She states this extended until Wednesday for which she took some Imodium for a couple of days. Since then, she has had 1 BM and has been well. Reports  that she is currently eating well but has some bloating and discomfort in her abdomen. Did not try IBGuard. Has tried to be very careful with trying spicy, greasy and food to avoid having her symptoms coming back.  The patient came to the office today as she was make sure she was not having any concerning symptoms that would preclude her to have her colonoscopy.  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  The patient denies having any nausea, vomiting, fever, chills, hematochezia, melena, hematemesis,  jaundice, pruritus or weight loss.  Last Colonoscopy:(11/21/15) Diverticulosis in the sigmoid colon, in the descending colon, at the splenic flexure and at the  hepatic flexure.External hemorrhoids. No specimens collected.   Last Endoscopy:(03/23/17 )Normal proximal esophagus and mid esophagus. Esophageal mucosal changes (confirmed Barrett's Esophagus). Moderate narrowing due to Schatzki ring. Dilated. Ring disrupted further with focal biopsy but no tissue saved. 2 cm hiatal hernia. Normal stomach. Normal duodenal bulb and second portion of the duodenum.  Past Medical History: Past Medical History:  Diagnosis Date   Arthritis    Breast cancer ([place])    Cancer ([place])    lt mastectomy-breast   Cancer ([place])    Left Breast   Chronic diarrhea    GERD (gastroesophageal reflux disease)    IBS (irritable bowel syndrome)     Past Surgical History: Past Surgical History:  Procedure Laterality Date   ABDOMINAL HYSTERECTOMY     BREAST SURGERY     CARPAL TUNNEL RELEASE  2015   left   CARPAL TUNNEL RELEASE Right 07/19/2014   Procedure: RIGHT CARPAL TUNNEL RELEASE AND TENOSYNOVECTOMY;  Surgeon: Leandrew Koyanagi, MD;  Location: [facility];  Service: Orthopedics;  Laterality: Right;   CARPAL TUNNEL RELEASE Left 05/20/2013   Procedure: CARPAL TUNNEL RELEASE;  Surgeon: Carole Civil, MD;  [facility];  Service: Orthopedics;  Laterality: Left;   COLONOSCOPY     COLONOSCOPY N/A 11/21/2015   Procedure: COLONOSCOPY;  Surgeon: Rogene Houston, MD;  [facility];  Service: Endoscopy;  Laterality: N/A;  1200   ESOPHAGEAL DILATION N/A 03/23/2017   Procedure: ESOPHAGEAL DILATION;  Surgeon: Rogene Houston, MD;  [facility];  Service: Endoscopy;  Laterality: N/A;   ESOPHAGOGASTRODUODENOSCOPY N/A 03/23/2017  Procedure: ESOPHAGOGASTRODUODENOSCOPY (EGD);  Surgeon: Rogene Houston, MD;  [facility];  Service: Endoscopy;  Laterality: N/A;  135   MASTECTOMY  1990   lt mast   MELANOMA EXCISION     leg   TENOSYNOVECTOMY Right 07/19/2014   Procedure: TENOSYNOVECTOMY;  Surgeon: Leandrew Koyanagi, MD;  Location: [facility];  Service: Orthopedics;  Laterality: Right;   TONSILLECTOMY     UPPER GI ENDOSCOPY      Family History: Family History  Problem Relation Age of Onset   Colon cancer Mother     Social History: Social History   Tobacco Use  Smoking Status Former   Packs/day: 0.50   Years: 10.00   Pack years: 5.00   Types: Cigarettes   Quit date: 05/12/1987   Years since quitting: 33.8  Smokeless Tobacco Never   Social History   Substance and Sexual Activity  Alcohol Use Yes   Alcohol/week: 2.0 standard drinks   Types: 1 Glasses of wine, 1 Shots of liquor per week   Comment: Daily   Social History   Substance and Sexual Activity  Drug Use No    Allergies: Allergies  Allergen Reactions   Celebrex [Celecoxib] Shortness Of Breath, Swelling and Rash     reddness    Medications: Current Outpatient Medications  Medication Sig Dispense Refill   amLODipine (NORVASC) 2.5 MG tablet Take 2.5 mg by mouth daily.     hydrochlorothiazide (MICROZIDE) 12.5 MG capsule Take 12.5 mg by mouth daily.     Multiple Vitamins-Minerals (CENTRUM SILVER PO) Take 1 tablet daily by mouth.     pantoprazole (PROTONIX) 40 MG tablet Take 1 tablet (40 mg total) by mouth daily before breakfast. 90 tablet 3   polyethylene glycol-electrolytes (TRILYTE) 420 g solution Take 4,000 mLs by mouth as directed. 4000 mL 0   raloxifene (EVISTA) 60 MG tablet Take 60 mg by mouth daily.     valACYclovir (VALTREX) 1000 MG tablet Take 1,000 mg by mouth.     No current facility-administered medications for this visit.    Review of Systems: GENERAL: negative for malaise, night sweats HEENT: No changes in hearing or vision, no nose bleeds or other nasal problems. NECK: Negative for lumps, goiter, pain and significant neck swelling RESPIRATORY: Negative for cough, wheezing CARDIOVASCULAR: Negative for chest pain, leg swelling, palpitations, orthopnea GI: SEE HPI MUSCULOSKELETAL: Negative for joint pain or  swelling, back pain, and muscle pain. SKIN: Negative for lesions, rash PSYCH: Negative for sleep disturbance, mood disorder and recent psychosocial stressors. HEMATOLOGY Negative for prolonged bleeding, bruising easily, and swollen nodes. ENDOCRINE: Negative for cold or heat intolerance, polyuria, polydipsia and goiter. NEURO: negative for tremor, gait imbalance, syncope and seizures. The remainder of the review of systems is noncontributory.   Physical Exam: BP (!) 148/79 (BP Location: Right Arm, Patient Position: Sitting, Cuff Size: Normal)   Pulse 82   Temp 98.4 F (36.9 C) (Oral)   Ht 5\' 4"  (1.626 m)   Wt 147 lb 14.4 oz (67.1 kg)   BMI 25.39 kg/m  GENERAL: The patient is AO x3, in no acute distress. HEENT: Head is normocephalic and atraumatic. EOMI are intact. Mouth is well hydrated and without lesions. NECK: Supple. No masses LUNGS: Clear to auscultation. No presence of rhonchi/wheezing/rales. Adequate chest expansion HEART: RRR, normal s1 and s2. ABDOMEN: Soft, nontender, no guarding, no peritoneal signs, and nondistended. BS +. No masses. EXTREMITIES: Without any cyanosis, clubbing, rash, lesions or edema. NEUROLOGIC: AOx3, no focal  motor deficit. SKIN: no jaundice, no rashes  Imaging/Labs: as above  I personally reviewed and interpreted the available labs, imaging and endoscopic files.  Impression and Plan: STARLIT RABURN is a 80 y.o. female with past medical history of IBS-D, breast cancer status postmastectomy and chemoradiation, GERD c/b Barrett's esophagus and arthritis, who presents for follow up of IBS.  The patient had an episode of worsening diarrhea which was self-limited and responded to the use of Lomotil.  She has presented persistent discomfort in her abdomen and bloating but has otherwise been doing well.  It is possible that this was triggered by her recent episode of stress but she has not presented any red flag signs and I do not consider that further  investigation is warranted at this point.  I had a thorough discussion with the patient, I discussed that stress and food could lead to worsening of her symptoms.  She would benefit from taking peppermint to decrease her bloating and she will also take Imodium as needed if the diarrhea recurs.  - Patient was counseled about the benefit of avoiding stressing situations and potential environmental triggers leading to symptomatology. - Start IBGard 1 tablet every 8-12 hours as needed for bloating and abdominal discomfort - Proceed with scheduled EGD and colonoscopy - Can take Imodium as needed if recurrent diarrhea.  All questions were answered.      Harvel Quale, MD Gastroenterology and Hepatology [facility]

## 2021-03-21 NOTE — Patient Instructions (Signed)
Your procedure is scheduled on: 03/27/2021  Report to [facility] at    6:15 AM.  Call this number if you have problems the morning of surgery: [phone]   Remember:              Follow Directions on the letter you received from Your Physician's office regarding the Bowel Prep              No Smoking the day of Procedure :   Take these medicines the morning of surgery with A SIP OF WATER: Pantoprazole   Do not wear jewelry, make-up or nail polish.    Do not bring valuables to the hospital.  Contacts, dentures or bridgework may not be worn into surgery.  .   Patients discharged the day of surgery will not be allowed to drive home.     Colonoscopy, Adult, Care After This sheet gives you information about how to care for yourself after your procedure. Your health care provider may also give you more specific instructions. If you have problems or questions, contact your health care provider. What can I expect after the procedure? After the procedure, it is common to have: A small amount of blood in your stool for 24 hours after the procedure. Some gas. Mild abdominal cramping or bloating.  Follow these instructions at home: General instructions  For the first 24 hours after the procedure: Do not drive or use machinery. Do not sign important documents. Do not drink alcohol. Do your regular daily activities at a slower pace than normal. Eat soft, easy-to-digest foods. Rest often. Take over-the-counter or prescription medicines only as told by your health care provider. It is up to you to get the results of your procedure. Ask your health care provider, or the department performing the procedure, when your results will be ready. Relieving cramping and bloating Try walking around when you have cramps or feel bloated. Apply heat to your abdomen as told by your health care provider. Use a heat source that your health care provider recommends, such as a moist heat pack or a  heating pad. Place a towel between your skin and the heat source. Leave the heat on for 20-30 minutes. Remove the heat if your skin turns bright red. This is especially important if you are unable to feel pain, heat, or cold. You may have a greater risk of getting burned. Eating and drinking Drink enough fluid to keep your urine clear or pale yellow. Resume your normal diet as instructed by your health care provider. Avoid heavy or fried foods that are hard to digest. Avoid drinking alcohol for as long as instructed by your health care provider. Contact a health care provider if: You have blood in your stool 2-3 days after the procedure. Get help right away if: You have more than a small spotting of blood in your stool. You pass large blood clots in your stool. Your abdomen is swollen. You have nausea or vomiting. You have a fever. You have increasing abdominal pain that is not relieved with medicine. This information is not intended to replace advice given to you by your health care provider. Make sure you discuss any questions you have with your health care provider. Document Released: 12/11/2003 Document Revised: 01/21/2016 Document Reviewed: 07/10/2015 Elsevier Interactive Patient Education  2018 [facility], Adult, Care After This sheet gives you information about how to care for yourself after your procedure. Your health care provider may also give you  more specific instructions. If you have problems or questions, contact your health care provider. What can I expect after the procedure? After the procedure, it is common to have: A sore throat. Mild stomach pain or discomfort. Bloating. Nausea. Follow these instructions at home:  Follow instructions from your health care provider about what to eat or drink after your procedure. Return to your normal activities as told by your health care provider. Ask your health care provider what activities are safe for  you. Take over-the-counter and prescription medicines only as told by your health care provider. If you were given a sedative during the procedure, it can affect you for several hours. Do not drive or operate machinery until your health care provider says that it is safe. Keep all follow-up visits as told by your health care provider. This is important. Contact a health care provider if you have: A sore throat that lasts longer than one day. Trouble swallowing. Get help right away if: You vomit blood or your vomit looks like coffee grounds. You have: A fever. Bloody, black, or tarry stools. A severe sore throat or you cannot swallow. Difficulty breathing. Severe pain in your chest or abdomen. Summary After the procedure, it is common to have a sore throat, mild stomach discomfort, bloating, and nausea. If you were given a sedative during the procedure, it can affect you for several hours. Do not drive or operate machinery until your health care provider says that it is safe. Follow instructions from your health care provider about what to eat or drink after your procedure. Return to your normal activities as told by your health care provider. This information is not intended to replace advice given to you by your health care provider. Make sure you discuss any questions you have with your health care provider. Document Revised: 03/04/2019 Document Reviewed: 09/28/2017 Elsevier Patient Education  2022 [employer].

## 2021-03-25 ENCOUNTER — Encounter (HOSPITAL_COMMUNITY)
Admission: RE | Admit: 2021-03-25 | Discharge: 2021-03-25 | Disposition: A | Discharge status: Home / Self Care | Payer: Medicare Other | Source: Ambulatory Visit | Attending: Internal Medicine | Admitting: Internal Medicine

## 2021-03-25 VITALS — BP 141/80 | HR 75 | Temp 98.4°F | Resp 18 | Ht 64.0 in | Wt 140.0 lb

## 2021-03-25 DIAGNOSIS — Z01812 Encounter for preprocedural laboratory examination: Secondary | ICD-10-CM | POA: Insufficient documentation

## 2021-03-25 DIAGNOSIS — Z79899 Other long term (current) drug therapy: Secondary | ICD-10-CM | POA: Insufficient documentation

## 2021-03-25 LAB — BASIC METABOLIC PANEL
Anion gap: 7 (ref 5–15)
BUN: 20 mg/dL (ref 8–23)
CO2: 26 mmol/L (ref 22–32)
Calcium: 8.9 mg/dL (ref 8.9–10.3)
Chloride: 105 mmol/L (ref 98–111)
Creatinine, Ser: 0.95 mg/dL (ref 0.44–1.00)
GFR, Estimated: >60 mL/min (ref >60)
Glucose, Bld: 103 mg/dL — ABNORMAL HIGH (ref 70–99)
Potassium: 3.8 mmol/L (ref 3.5–5.1)
Sodium: 138 mmol/L (ref 135–145)

## 2021-03-27 ENCOUNTER — Ambulatory Visit (HOSPITAL_COMMUNITY): Payer: Medicare Other | Admitting: Anesthesiology

## 2021-03-27 ENCOUNTER — Encounter (HOSPITAL_COMMUNITY)
Admission: RE | Disposition: A | Discharge status: Home / Self Care | Payer: Self-pay | Source: Home / Self Care | Attending: Internal Medicine

## 2021-03-27 ENCOUNTER — Ambulatory Visit (HOSPITAL_COMMUNITY)
Admission: RE | Admit: 2021-03-27 | Discharge: 2021-03-27 | Disposition: A | Discharge status: Home / Self Care | Payer: Medicare Other | Attending: Internal Medicine | Admitting: Internal Medicine

## 2021-03-27 ENCOUNTER — Encounter (HOSPITAL_COMMUNITY): Payer: Self-pay | Admitting: Internal Medicine

## 2021-03-27 DIAGNOSIS — Z87891 Personal history of nicotine dependence: Secondary | ICD-10-CM | POA: Insufficient documentation

## 2021-03-27 DIAGNOSIS — K5730 Diverticulosis of large intestine without perforation or abscess without bleeding: Secondary | ICD-10-CM | POA: Insufficient documentation

## 2021-03-27 DIAGNOSIS — K31A19 Gastric intestinal metaplasia without dysplasia, unspecified site: Secondary | ICD-10-CM | POA: Diagnosis not present

## 2021-03-27 DIAGNOSIS — Z86010 Personal history of colonic polyps: Secondary | ICD-10-CM | POA: Diagnosis not present

## 2021-03-27 DIAGNOSIS — K449 Diaphragmatic hernia without obstruction or gangrene: Secondary | ICD-10-CM | POA: Insufficient documentation

## 2021-03-27 DIAGNOSIS — K648 Other hemorrhoids: Secondary | ICD-10-CM | POA: Diagnosis not present

## 2021-03-27 DIAGNOSIS — Z09 Encounter for follow-up examination after completed treatment for conditions other than malignant neoplasm: Secondary | ICD-10-CM | POA: Diagnosis not present

## 2021-03-27 DIAGNOSIS — K2270 Barrett's esophagus without dysplasia: Secondary | ICD-10-CM | POA: Diagnosis not present

## 2021-03-27 DIAGNOSIS — Z1211 Encounter for screening for malignant neoplasm of colon: Secondary | ICD-10-CM | POA: Insufficient documentation

## 2021-03-27 DIAGNOSIS — Z800 Family history of malignant neoplasm of digestive organs: Secondary | ICD-10-CM | POA: Insufficient documentation

## 2021-03-27 DIAGNOSIS — K635 Polyp of colon: Secondary | ICD-10-CM | POA: Diagnosis not present

## 2021-03-27 DIAGNOSIS — Z13810 Encounter for screening for upper gastrointestinal disorder: Secondary | ICD-10-CM | POA: Diagnosis not present

## 2021-03-27 DIAGNOSIS — Z853 Personal history of malignant neoplasm of breast: Secondary | ICD-10-CM | POA: Diagnosis not present

## 2021-03-27 DIAGNOSIS — K209 Esophagitis, unspecified without bleeding: Secondary | ICD-10-CM | POA: Diagnosis not present

## 2021-03-27 HISTORY — PX: COLONOSCOPY WITH PROPOFOL: SHX5780

## 2021-03-27 HISTORY — PX: ESOPHAGOGASTRODUODENOSCOPY (EGD) WITH PROPOFOL: SHX5813

## 2021-03-27 HISTORY — PX: BIOPSY: SHX5522

## 2021-03-27 LAB — HM COLONOSCOPY

## 2021-03-27 SURGERY — COLONOSCOPY WITH PROPOFOL
Anesthesia: General

## 2021-03-27 MED ORDER — METAMUCIL SMOOTH TEXTURE 58.6 % PO POWD: 1.0000 | Freq: Every day | ORAL | Status: AC

## 2021-03-27 MED ORDER — LIDOCAINE HCL (PF) 2 % IJ SOLN
INTRAMUSCULAR | Status: AC
  Filled 2021-03-27: qty 10

## 2021-03-27 MED ORDER — LIDOCAINE HCL 1 % IJ SOLN
INTRAMUSCULAR | Status: DC | PRN
  Administered 2021-03-27: 50 mg via INTRADERMAL

## 2021-03-27 MED ORDER — LACTATED RINGERS IV SOLN: INTRAVENOUS | Status: DC

## 2021-03-27 MED ORDER — PROPOFOL 500 MG/50ML IV EMUL
INTRAVENOUS | Status: DC | PRN
  Administered 2021-03-27: 150 ug/kg/min via INTRAVENOUS

## 2021-03-27 MED ORDER — LOPERAMIDE HCL 2 MG PO CAPS: 2.0000 mg | ORAL_CAPSULE | Freq: Every day | ORAL | Status: AC

## 2021-03-27 MED ORDER — PROPOFOL 10 MG/ML IV BOLUS
INTRAVENOUS | Status: DC | PRN
  Administered 2021-03-27: 50 mg via INTRAVENOUS
  Administered 2021-03-27: 20 mg via INTRAVENOUS
  Administered 2021-03-27: 50 mg via INTRAVENOUS

## 2021-03-27 NOTE — Anesthesia Postprocedure Evaluation (Signed)
Anesthesia Post Note  Patient: Sonya [name]  Procedure(s) Performed: COLONOSCOPY WITH PROPOFOL ESOPHAGOGASTRODUODENOSCOPY (EGD) WITH PROPOFOL BIOPSY  Patient location during evaluation: Short Stay Anesthesia Type: General Level of consciousness: awake and alert Pain management: pain level controlled Vital Signs Assessment: post-procedure vital signs reviewed and stable Respiratory status: spontaneous breathing Cardiovascular status: blood pressure returned to baseline and stable Postop Assessment: no apparent nausea or vomiting Anesthetic complications: no   No notable events documented.   Last Vitals:  Vitals:   03/27/21 0702 03/27/21 0705  BP:  (!) 157/81  Pulse: 92   Resp: 18   Temp: 36.6 C   SpO2: 98%     Last Pain:  Vitals:   03/27/21 0738  TempSrc:   PainSc: 0-No pain                 Stina Gane

## 2021-03-27 NOTE — Op Note (Signed)
[facility] Patient Name: Sonya [name] Procedure Date: 03/27/2021 7:25 AM MRN: [id number] Date of Birth: 1940/06/06 Attending MD: Hildred Laser , MD CSN: [id number] Age: 80 Admit Type: Outpatient Procedure:                Upper GI endoscopy Indications:              Barrett's esophagus, Follow-up of Barrett's                            esophagus Providers:                Hildred Laser, MD, Charlsie Quest. Theda Sers RN, RN,                            Raphael Gibney, Technician Referring MD:             Asencion Noble, MD Medicines:                Propofol per Anesthesia Complications:            No immediate complications. Estimated Blood Loss:     Estimated blood loss was minimal. Procedure:                Pre-Anesthesia Assessment:                           - Prior to the procedure, a History and Physical                            was performed, and patient medications and                            allergies were reviewed. The patient's tolerance of                            previous anesthesia was also reviewed. The risks                            and benefits of the procedure and the sedation                            options and risks were discussed with the patient.                            All questions were answered, and informed consent                            was obtained. Prior Anticoagulants: The patient has                            taken no previous anticoagulant or antiplatelet                            agents. ASA Grade Assessment: II - A patient with  mild systemic disease. After reviewing the risks                            and benefits, the patient was deemed in                            satisfactory condition to undergo the procedure.                           After obtaining informed consent, the endoscope was                            passed under direct vision. Throughout the                            procedure, the patient's  blood pressure, pulse, and                            oxygen saturations were monitored continuously. The                            [id number]-H190 ([id number]) scope was introduced through the                            mouth, and advanced to the second part of duodenum.                            The upper GI endoscopy was accomplished without                            difficulty. The patient tolerated the procedure                            well. Scope In: 7:41:53 AM Scope Out: 7:49:14 AM Total Procedure Duration: 0 hours 7 minutes 21 seconds  Findings:      The hypopharynx was normal.      The proximal esophagus and mid esophagus were normal.      There were esophageal mucosal changes secondary to established       short-segment Barrett's disease present in the distal esophagus. there       were 3 small islands of Barrett's mucosa. Mucosa was biopsied with a       cold forceps for histology randomly at 36 cm from the incisors. One       specimen bottle was sent to pathology. The pathology specimen was placed       into Bottle Number 1.      The Z-line was regular and was found 37 cm from the incisors.      A 2 cm hiatal hernia was present.      The entire examined stomach was normal.      The duodenal bulb and second portion of the duodenum were normal. Impression:               - Normal hypopharynx.                           -  Normal proximal esophagus and mid esophagus.                           - Esophageal mucosal changes secondary to                            established short-segment Barrett's disease.                            Biopsied.                           - Z-line regular, 37 cm from the incisors.                           - 2 cm hiatal hernia.                           - Normal stomach.                           - Normal duodenal bulb and second portion of the                            duodenum. Moderate Sedation:      Per Anesthesia Care Recommendation:           -  Patient has a contact number available for                            emergencies. The signs and symptoms of potential                            delayed complications were discussed with the                            patient. Return to normal activities tomorrow.                            Written discharge instructions were provided to the                            patient.                           - Resume previous diet today.                           - Continue present medications.                           - Await pathology results. Procedure Code(s):        --- Professional ---                           [phone], Esophagogastroduodenoscopy, flexible,  transoral; with biopsy, single or multiple Diagnosis Code(s):        --- Professional ---                           K22.70, Barrett's esophagus without dysplasia                           K44.9, Diaphragmatic hernia without obstruction or                            gangrene CPT copyright 2019 American Medical Association. All rights reserved. The codes documented in this report are preliminary and upon coder review may  be revised to meet current compliance requirements. Hildred Laser, MD Hildred Laser, MD 03/27/2021 8:26:41 AM This report has been signed electronically. Number of Addenda: 0

## 2021-03-27 NOTE — Discharge Instructions (Addendum)
No aspirin or NSAIDs for 24 hours. Resume scheduled medications as before. Imodium OTC 2 mg by mouth daily with breakfast. Metamucil 1 packet or 3 to 4 g by mouth daily at bedtime. High-fiber diet. No driving for 24 hours. Physician will call with biopsy results and further recommendations.

## 2021-03-27 NOTE — Anesthesia Preprocedure Evaluation (Signed)
Anesthesia Evaluation  Patient identified by MRN, date of birth, ID band Patient awake    Reviewed: Allergy & Precautions, H&P , NPO status , Patient's Chart, lab work & pertinent test results, reviewed documented beta blocker date and time   Airway Mallampati: II  TM Distance: >3 FB Neck ROM: full    Dental no notable dental hx.    Pulmonary neg pulmonary ROS, former smoker,    Pulmonary exam normal breath sounds clear to auscultation       Cardiovascular Exercise Tolerance: Good negative cardio ROS   Rhythm:regular Rate:Normal     Neuro/Psych  Neuromuscular disease negative psych ROS   GI/Hepatic Neg liver ROS, GERD  Medicated,  Endo/Other  negative endocrine ROS  Renal/GU negative Renal ROS  negative genitourinary   Musculoskeletal   Abdominal   Peds  Hematology negative hematology ROS (+)   Anesthesia Other Findings   Reproductive/Obstetrics negative OB ROS                             Anesthesia Physical Anesthesia Plan  ASA: 2  Anesthesia Plan: General   Post-op Pain Management:    Induction:   PONV Risk Score and Plan: Propofol infusion  Airway Management Planned:   Additional Equipment:   Intra-op Plan:   Post-operative Plan:   Informed Consent: I have reviewed the patients History and Physical, chart, labs and discussed the procedure including the risks, benefits and alternatives for the proposed anesthesia with the patient or authorized representative who has indicated his/her understanding and acceptance.     Dental Advisory Given  Plan Discussed with: CRNA  Anesthesia Plan Comments:         Anesthesia Quick Evaluation  

## 2021-03-27 NOTE — Transfer of Care (Signed)
Immediate Anesthesia Transfer of Care Note  Patient: Sonya [name]  Procedure(s) Performed: COLONOSCOPY WITH PROPOFOL ESOPHAGOGASTRODUODENOSCOPY (EGD) WITH PROPOFOL BIOPSY  Patient Location: Short Stay  Anesthesia Type:General  Level of Consciousness: awake  Airway & Oxygen Therapy: Patient Spontanous Breathing  Post-op Assessment: Report given to RN  Post vital signs: Reviewed  Last Vitals:  Vitals Value Taken Time  BP    Temp    Pulse    Resp    SpO2      Last Pain:  Vitals:   03/27/21 0738  TempSrc:   PainSc: 0-No pain      Patients Stated Pain Goal: 5 (20/81/38 8719)  Complications: No notable events documented.

## 2021-03-27 NOTE — H&P (Signed)
Sonya [name] is an 80 y.o. female.   Chief Complaint: Patient is here for esophagogastroduodenoscopy and colonoscopy. HPI: Patient is 80 year old Caucasian female who has chronic GERD complicated by short segment Barrett's esophagus who is undergoing surveillance esophagogastroduodenoscopy.  Her last exam was 4 years ago.  She says heartburn is well controlled with diet and medication and she denies dysphagia. She is also undergoing surveillance colonoscopy.  She has a history of colonic adenomas and family history of colon carcinoma in mother who was in her early 72s and maternal grandmother who died of metastatic colon carcinoma at age 51. Patient complains of intermittent diarrhea.  She has periods where she has 5-6 stools and then she has normal stools.  No history of constipation abdominal pain or rectal bleeding.  Her appetite is good and her weight has been stable.  She has used Lomotil on as-needed basis. Personal history significant for breast carcinoma and she remains in remission.  Past Medical History:  Diagnosis Date   Arthritis    Breast cancer ([place])    Cancer ([place])    lt mastectomy-breast   Cancer ([place])    Left Breast   Chronic diarrhea    GERD (gastroesophageal reflux disease)    IBS (irritable bowel syndrome)     Past Surgical History:  Procedure Laterality Date   ABDOMINAL HYSTERECTOMY     BREAST SURGERY     CARPAL TUNNEL RELEASE  2015   left   CARPAL TUNNEL RELEASE Right 07/19/2014   Procedure: RIGHT CARPAL TUNNEL RELEASE AND TENOSYNOVECTOMY;  Surgeon: Leandrew Koyanagi, MD;  Location: [facility];  Service: Orthopedics;  Laterality: Right;   CARPAL TUNNEL RELEASE Left 05/20/2013   Procedure: CARPAL TUNNEL RELEASE;  Surgeon: Carole Civil, MD;  Location: [facility];  Service: Orthopedics;  Laterality: Left;   COLONOSCOPY     COLONOSCOPY N/A 11/21/2015   Procedure: COLONOSCOPY;  Surgeon: Rogene Houston, MD;  Location: [facility];  Service: Endoscopy;   Laterality: N/A;  1200   ESOPHAGEAL DILATION N/A 03/23/2017   Procedure: ESOPHAGEAL DILATION;  Surgeon: Rogene Houston, MD;  [facility];  Service: Endoscopy;  Laterality: N/A;   ESOPHAGOGASTRODUODENOSCOPY N/A 03/23/2017   Procedure: ESOPHAGOGASTRODUODENOSCOPY (EGD);  Surgeon: Rogene Houston, MD;  [facility];  Service: Endoscopy;  Laterality: N/A;  135   MASTECTOMY  1990   lt mast   MELANOMA EXCISION     leg   TENOSYNOVECTOMY Right 07/19/2014   Procedure: TENOSYNOVECTOMY;  Surgeon: Leandrew Koyanagi, MD;  Location: [facility];  Service: Orthopedics;  Laterality: Right;   TONSILLECTOMY     UPPER GI ENDOSCOPY      Family History  Problem Relation Age of Onset   Colon cancer Mother    Social History:  reports that she quit smoking about 33 years ago. Her smoking use included cigarettes. She has a 5.00 pack-year smoking history. She has never used smokeless tobacco. She reports current alcohol use of about 2.0 standard drinks per week. She reports that she does not use drugs.  Allergies:  Allergies  Allergen Reactions   Celebrex [Celecoxib] Shortness Of Breath, Swelling and Rash     reddness    Medications Prior to Admission  Medication Sig Dispense Refill   hydrochlorothiazide (MICROZIDE) 12.5 MG capsule Take 12.5 mg by mouth daily.     Multiple Vitamins-Minerals (CENTRUM SILVER PO) Take 1 tablet daily by mouth.     pantoprazole (PROTONIX) 40 MG tablet  Take 1 tablet (40 mg total) by mouth daily before breakfast. 90 tablet 3   polyethylene glycol-electrolytes (TRILYTE) 420 g solution Take 4,000 mLs by mouth as directed. 4000 mL 0   raloxifene (EVISTA) 60 MG tablet Take 60 mg by mouth daily.     valACYclovir (VALTREX) 1000 MG tablet Take 1,000 mg by mouth daily.      Results for orders placed or performed during the hospital encounter of 03/25/21 (from the past 48 hour(s))  Basic metabolic panel     Status: Abnormal   Collection Time: 03/25/21  10:56 AM  Result Value Ref Range   Sodium 138 135 - 145 mmol/L   Potassium 3.8 3.5 - 5.1 mmol/L   Chloride 105 98 - 111 mmol/L   CO2 26 22 - 32 mmol/L   Glucose, Bld 103 (H) 70 - 99 mg/dL    Comment: Glucose reference range applies only to samples taken after fasting for at least 8 hours.   BUN 20 8 - 23 mg/dL   Creatinine, Ser 0.95 0.44 - 1.00 mg/dL   Calcium 8.9 8.9 - 10.3 mg/dL   GFR, Estimated >60 >60 mL/min    Comment: (NOTE) Calculated using the CKD-EPI Creatinine Equation (2021)    Anion gap 7 5 - 15    Comment: Performed at [facility], [address] [place], [place] [address]   No results found.  Review of Systems  Blood pressure (!) 157/81, pulse 92, temperature 97.9 F (36.6 C), temperature source Oral, resp. rate 18, SpO2 98 %. Physical Exam HENT:     Mouth/Throat:     Mouth: Mucous membranes are moist.     Pharynx: Oropharynx is clear.  Eyes:     General: No scleral icterus.    Conjunctiva/sclera: Conjunctivae normal.  Cardiovascular:     Rate and Rhythm: Normal rate and regular rhythm.     Heart sounds: Normal heart sounds. No murmur heard. Pulmonary:     Effort: Pulmonary effort is normal.     Breath sounds: Normal breath sounds.  Abdominal:     General: There is no distension.     Palpations: Abdomen is soft. There is no mass.     Tenderness: There is no abdominal tenderness.  Musculoskeletal:        General: No swelling.     Cervical back: Neck supple.  Lymphadenopathy:     Cervical: No cervical adenopathy.  Skin:    General: Skin is warm and dry.  Neurological:     Mental Status: She is alert.     Assessment/Plan  Short segment Barrett's esophagus and history of colonic adenomas. Family history of colon carcinoma in her first and a second-degree relative. History of diarrhea felt to be due to IBS. Surveillance esophagogastroduodenoscopy and colonoscopy.  Hildred Laser, MD 03/27/2021, 7:28 AM

## 2021-03-27 NOTE — Op Note (Signed)
[facility] Patient Name: Sonya [name] Procedure Date: 03/27/2021 7:53 AM MRN: [id number] Date of Birth: 04/09/1941 Attending MD: Hildred Laser , MD CSN: [id number] Age: 80 Admit Type: Outpatient Procedure:                Colonoscopy Indications:              High risk colon cancer surveillance: Personal                            history of colonic polyps Providers:                Hildred Laser, MD, Charlsie Quest. Theda Sers RN, RN,                            Raphael Gibney, Technician Referring MD:             Asencion Noble, MD Medicines:                Propofol per Anesthesia Complications:            No immediate complications. Estimated Blood Loss:     Estimated blood loss was minimal. Procedure:                Pre-Anesthesia Assessment:                           - Prior to the procedure, a History and Physical                            was performed, and patient medications and                            allergies were reviewed. The patient's tolerance of                            previous anesthesia was also reviewed. The risks                            and benefits of the procedure and the sedation                            options and risks were discussed with the patient.                            All questions were answered, and informed consent                            was obtained. Prior Anticoagulants: The patient has                            taken no previous anticoagulant or antiplatelet                            agents. ASA Grade Assessment: II - A patient with  mild systemic disease. After reviewing the risks                            and benefits, the patient was deemed in                            satisfactory condition to undergo the procedure.                           After obtaining informed consent, the colonoscope                            was passed under direct vision. Throughout the                            procedure,  the patient's blood pressure, pulse, and                            oxygen saturations were monitored continuously. The                            [id number]-[id number] ([id number]) scope was introduced through                            the anus and advanced to the the terminal ileum,                            with identification of the appendiceal orifice and                            IC valve. The colonoscopy was performed without                            difficulty. The patient tolerated the procedure                            well. The quality of the bowel preparation was                            excellent. The terminal ileum, ileocecal valve,                            appendiceal orifice, and rectum were photographed. Scope In: 7:54:18 AM Scope Out: 8:13:25 AM Scope Withdrawal Time: 0 hours 11 minutes 39 seconds  Total Procedure Duration: 0 hours 19 minutes 7 seconds  Findings:      The perianal and digital rectal examinations were normal.      The terminal ileum appeared normal.      Multiple small and large-mouthed diverticula were found in the sigmoid       colon, transverse colon and hepatic flexure.      There is no endoscopic evidence of colitis. Biopsies for histology were       taken with a cold forceps from the ascending colon and sigmoid colon for       evaluation  of microscopic colitis. The pathology specimen was placed       into Bottle Number 2.      Internal hemorrhoids were found during retroflexion. The hemorrhoids       were small. Impression:               - The examined portion of the ileum was normal.                           - Diverticulosis in the sigmoid colon, in the                            transverse colon and at the hepatic flexure.                           - Random biopsies taken from ascending and sigmoid                            colon looking for microscopic colitis                           - Internal hemorrhoids. Moderate Sedation:      Per  Anesthesia Care      Moderate (conscious) sedation was administered by the endoscopy nurse       and supervised by the endoscopist. The patient's oxygen saturation,       heart rate, blood pressure and response to care were monitored. Total       physician intraservice time was 12 minutes. Recommendation:           - Patient has a contact number available for                            emergencies. The signs and symptoms of potential                            delayed complications were discussed with the                            patient. Return to normal activities tomorrow.                            Written discharge instructions were provided to the                            patient.                           - High fiber diet today.                           - Continue present medications.                           - Metamucil 3 to 4 g by mouth daily at bedtime.                           -  Imodium OTC 2 mg by mouth daily with breakfast                           - No aspirin, ibuprofen, naproxen, or other                            non-steroidal anti-inflammatory drugs for 1 day.                           - Await pathology results.                           - No recommendation at this time regarding repeat                            colonoscopy. Procedure Code(s):        --- Professional ---                           [phone], Colonoscopy, flexible; with biopsy, single                            or multiple Diagnosis Code(s):        --- Professional ---                           Z86.010, Personal history of colonic polyps                           K64.8, Other hemorrhoids                           K57.30, Diverticulosis of large intestine without                            perforation or abscess without bleeding CPT copyright 2019 [employer]. All rights reserved. The codes documented in this report are preliminary and upon coder review may  be revised to meet  current compliance requirements. Hildred Laser, MD Hildred Laser, MD 03/27/2021 8:33:13 AM This report has been signed electronically. Number of Addenda: 0

## 2021-03-28 LAB — SURGICAL PATHOLOGY

## 2021-03-29 ENCOUNTER — Encounter (HOSPITAL_COMMUNITY): Payer: Self-pay | Admitting: Internal Medicine

## 2021-04-01 DIAGNOSIS — I10 Essential (primary) hypertension: Secondary | ICD-10-CM | POA: Diagnosis not present

## 2021-04-01 DIAGNOSIS — N1831 Chronic kidney disease, stage 3a: Secondary | ICD-10-CM | POA: Diagnosis not present

## 2021-04-01 DIAGNOSIS — Z79899 Other long term (current) drug therapy: Secondary | ICD-10-CM | POA: Diagnosis not present

## 2021-04-03 ENCOUNTER — Encounter (INDEPENDENT_AMBULATORY_CARE_PROVIDER_SITE_OTHER): Payer: Self-pay | Admitting: *Deleted

## 2021-04-11 DIAGNOSIS — Z23 Encounter for immunization: Secondary | ICD-10-CM | POA: Diagnosis not present

## 2021-04-11 DIAGNOSIS — N1831 Chronic kidney disease, stage 3a: Secondary | ICD-10-CM | POA: Diagnosis not present

## 2021-04-11 DIAGNOSIS — I10 Essential (primary) hypertension: Secondary | ICD-10-CM | POA: Diagnosis not present

## 2021-04-22 ENCOUNTER — Other Ambulatory Visit (HOSPITAL_COMMUNITY): Payer: Self-pay | Admitting: Internal Medicine

## 2021-04-22 DIAGNOSIS — Z1231 Encounter for screening mammogram for malignant neoplasm of breast: Secondary | ICD-10-CM

## 2021-05-08 ENCOUNTER — Ambulatory Visit (HOSPITAL_COMMUNITY): Payer: Medicare Other

## 2021-05-24 ENCOUNTER — Ambulatory Visit (HOSPITAL_COMMUNITY)
Admission: RE | Admit: 2021-05-24 | Discharge: 2021-05-24 | Disposition: A | Discharge status: Home / Self Care | Payer: Medicare Other | Source: Ambulatory Visit | Attending: Internal Medicine | Admitting: Internal Medicine

## 2021-05-24 ENCOUNTER — Other Ambulatory Visit: Payer: Self-pay

## 2021-05-24 DIAGNOSIS — Z1231 Encounter for screening mammogram for malignant neoplasm of breast: Secondary | ICD-10-CM | POA: Diagnosis not present

## 2021-06-03 ENCOUNTER — Telehealth (INDEPENDENT_AMBULATORY_CARE_PROVIDER_SITE_OTHER): Payer: Self-pay | Admitting: *Deleted

## 2021-06-03 NOTE — Telephone Encounter (Signed)
Pt left voicemail that she wanted to thank Dr. Laural Golden for his kindness and thoughtfulness. She states she is keeping a food diary like he told her and taking metamucil and imodium and states it is working great and everything has been good with this regimen.   Pt last seen for tcs and egd on 03/27/21  [id number]

## 2021-06-04 NOTE — Telephone Encounter (Signed)
Update given to dr Laural Golden and he states he is glad she is doing well.

## 2021-06-05 NOTE — Telephone Encounter (Signed)
Patient notified that I did give dr Laural Golden her message and he states he is glad she is doing well and I let her know to follow up if any problems.

## 2021-06-06 DIAGNOSIS — R350 Frequency of micturition: Secondary | ICD-10-CM | POA: Diagnosis not present

## 2021-08-06 DIAGNOSIS — N1831 Chronic kidney disease, stage 3a: Secondary | ICD-10-CM | POA: Diagnosis not present

## 2021-08-06 DIAGNOSIS — M810 Age-related osteoporosis without current pathological fracture: Secondary | ICD-10-CM | POA: Diagnosis not present

## 2021-08-06 DIAGNOSIS — I10 Essential (primary) hypertension: Secondary | ICD-10-CM | POA: Diagnosis not present

## 2021-08-06 DIAGNOSIS — C50912 Malignant neoplasm of unspecified site of left female breast: Secondary | ICD-10-CM | POA: Diagnosis not present

## 2021-08-06 DIAGNOSIS — K219 Gastro-esophageal reflux disease without esophagitis: Secondary | ICD-10-CM | POA: Diagnosis not present

## 2021-08-13 DIAGNOSIS — M810 Age-related osteoporosis without current pathological fracture: Secondary | ICD-10-CM | POA: Diagnosis not present

## 2021-08-13 DIAGNOSIS — I10 Essential (primary) hypertension: Secondary | ICD-10-CM | POA: Diagnosis not present

## 2021-08-13 DIAGNOSIS — N1831 Chronic kidney disease, stage 3a: Secondary | ICD-10-CM | POA: Diagnosis not present

## 2021-08-13 DIAGNOSIS — Z0000 Encounter for general adult medical examination without abnormal findings: Secondary | ICD-10-CM | POA: Diagnosis not present

## 2021-08-13 DIAGNOSIS — Z853 Personal history of malignant neoplasm of breast: Secondary | ICD-10-CM | POA: Diagnosis not present

## 2021-09-26 ENCOUNTER — Other Ambulatory Visit (INDEPENDENT_AMBULATORY_CARE_PROVIDER_SITE_OTHER): Payer: Self-pay | Admitting: Internal Medicine

## 2021-09-26 DIAGNOSIS — K219 Gastro-esophageal reflux disease without esophagitis: Secondary | ICD-10-CM

## 2021-12-06 DIAGNOSIS — R7301 Impaired fasting glucose: Secondary | ICD-10-CM | POA: Diagnosis not present

## 2021-12-06 DIAGNOSIS — N1831 Chronic kidney disease, stage 3a: Secondary | ICD-10-CM | POA: Diagnosis not present

## 2021-12-06 DIAGNOSIS — I10 Essential (primary) hypertension: Secondary | ICD-10-CM | POA: Diagnosis not present

## 2021-12-11 DIAGNOSIS — L570 Actinic keratosis: Secondary | ICD-10-CM | POA: Diagnosis not present

## 2021-12-11 DIAGNOSIS — L821 Other seborrheic keratosis: Secondary | ICD-10-CM | POA: Diagnosis not present

## 2021-12-11 DIAGNOSIS — Z85828 Personal history of other malignant neoplasm of skin: Secondary | ICD-10-CM | POA: Diagnosis not present

## 2021-12-13 DIAGNOSIS — N289 Disorder of kidney and ureter, unspecified: Secondary | ICD-10-CM | POA: Diagnosis not present

## 2021-12-13 DIAGNOSIS — I10 Essential (primary) hypertension: Secondary | ICD-10-CM | POA: Diagnosis not present

## 2022-02-05 DIAGNOSIS — Z23 Encounter for immunization: Secondary | ICD-10-CM | POA: Diagnosis not present

## 2022-04-16 ENCOUNTER — Other Ambulatory Visit (INDEPENDENT_AMBULATORY_CARE_PROVIDER_SITE_OTHER): Payer: Self-pay | Admitting: Internal Medicine

## 2022-04-16 DIAGNOSIS — K589 Irritable bowel syndrome without diarrhea: Secondary | ICD-10-CM | POA: Diagnosis not present

## 2022-04-16 DIAGNOSIS — I10 Essential (primary) hypertension: Secondary | ICD-10-CM | POA: Diagnosis not present

## 2022-04-16 DIAGNOSIS — K219 Gastro-esophageal reflux disease without esophagitis: Secondary | ICD-10-CM

## 2022-04-18 ENCOUNTER — Other Ambulatory Visit (INDEPENDENT_AMBULATORY_CARE_PROVIDER_SITE_OTHER): Payer: Self-pay | Admitting: Internal Medicine

## 2022-04-18 DIAGNOSIS — K219 Gastro-esophageal reflux disease without esophagitis: Secondary | ICD-10-CM

## 2022-04-22 DIAGNOSIS — L821 Other seborrheic keratosis: Secondary | ICD-10-CM | POA: Diagnosis not present

## 2022-04-22 DIAGNOSIS — L570 Actinic keratosis: Secondary | ICD-10-CM | POA: Diagnosis not present

## 2022-04-22 DIAGNOSIS — Z85828 Personal history of other malignant neoplasm of skin: Secondary | ICD-10-CM | POA: Diagnosis not present

## 2022-04-22 DIAGNOSIS — C44519 Basal cell carcinoma of skin of other part of trunk: Secondary | ICD-10-CM | POA: Diagnosis not present

## 2022-04-23 ENCOUNTER — Other Ambulatory Visit (HOSPITAL_COMMUNITY): Payer: Self-pay | Admitting: Internal Medicine

## 2022-04-23 DIAGNOSIS — Z1231 Encounter for screening mammogram for malignant neoplasm of breast: Secondary | ICD-10-CM

## 2022-05-26 ENCOUNTER — Ambulatory Visit (HOSPITAL_COMMUNITY)
Admission: RE | Admit: 2022-05-26 | Discharge: 2022-05-26 | Disposition: A | Discharge status: Home / Self Care | Payer: Medicare Other | Source: Ambulatory Visit | Attending: Internal Medicine | Admitting: Internal Medicine

## 2022-05-26 DIAGNOSIS — Z1231 Encounter for screening mammogram for malignant neoplasm of breast: Secondary | ICD-10-CM | POA: Diagnosis not present

## 2022-08-07 DIAGNOSIS — I10 Essential (primary) hypertension: Secondary | ICD-10-CM | POA: Diagnosis not present

## 2022-08-07 DIAGNOSIS — K580 Irritable bowel syndrome with diarrhea: Secondary | ICD-10-CM | POA: Diagnosis not present

## 2022-08-07 DIAGNOSIS — C50919 Malignant neoplasm of unspecified site of unspecified female breast: Secondary | ICD-10-CM | POA: Diagnosis not present

## 2022-08-07 DIAGNOSIS — N1831 Chronic kidney disease, stage 3a: Secondary | ICD-10-CM | POA: Diagnosis not present

## 2022-08-07 DIAGNOSIS — Z79899 Other long term (current) drug therapy: Secondary | ICD-10-CM | POA: Diagnosis not present

## 2022-08-18 ENCOUNTER — Other Ambulatory Visit (HOSPITAL_COMMUNITY): Payer: Self-pay | Admitting: Internal Medicine

## 2022-08-18 DIAGNOSIS — Z13820 Encounter for screening for osteoporosis: Secondary | ICD-10-CM

## 2022-08-18 DIAGNOSIS — Z0000 Encounter for general adult medical examination without abnormal findings: Secondary | ICD-10-CM | POA: Diagnosis not present

## 2022-08-18 DIAGNOSIS — N1831 Chronic kidney disease, stage 3a: Secondary | ICD-10-CM | POA: Diagnosis not present

## 2022-08-18 DIAGNOSIS — M810 Age-related osteoporosis without current pathological fracture: Secondary | ICD-10-CM | POA: Diagnosis not present

## 2022-08-18 DIAGNOSIS — I10 Essential (primary) hypertension: Secondary | ICD-10-CM | POA: Diagnosis not present

## 2022-08-18 DIAGNOSIS — Z853 Personal history of malignant neoplasm of breast: Secondary | ICD-10-CM | POA: Diagnosis not present

## 2022-08-27 ENCOUNTER — Ambulatory Visit (HOSPITAL_COMMUNITY)
Admission: RE | Admit: 2022-08-27 | Discharge: 2022-08-27 | Disposition: A | Discharge status: Home / Self Care | Payer: Medicare Other | Source: Ambulatory Visit | Attending: Internal Medicine | Admitting: Internal Medicine

## 2022-08-27 DIAGNOSIS — M810 Age-related osteoporosis without current pathological fracture: Secondary | ICD-10-CM | POA: Diagnosis not present

## 2022-08-27 DIAGNOSIS — Z13820 Encounter for screening for osteoporosis: Secondary | ICD-10-CM | POA: Insufficient documentation

## 2022-08-27 DIAGNOSIS — Z853 Personal history of malignant neoplasm of breast: Secondary | ICD-10-CM | POA: Diagnosis not present

## 2022-08-27 DIAGNOSIS — Z780 Asymptomatic menopausal state: Secondary | ICD-10-CM | POA: Insufficient documentation

## 2022-11-05 DIAGNOSIS — J019 Acute sinusitis, unspecified: Secondary | ICD-10-CM | POA: Diagnosis not present

## 2022-11-08 IMAGING — MG MM DIGITAL SCREENING UNILAT*R* W/ TOMO W/ CAD
4 series · 4 of 12 positions shown · non-contrast
Comparison: Previous exam(s).

CLINICAL DATA: Screening.

EXAM:
DIGITAL SCREENING UNILATERAL RIGHT MAMMOGRAM WITH CAD AND
TOMOSYNTHESIS
TECHNIQUE: Right screening digital craniocaudal and mediolateral oblique
mammograms were obtained. Right screening digital breast
tomosynthesis was performed. The images were evaluated with
computer-aided detection.

[R CC synth-2D]
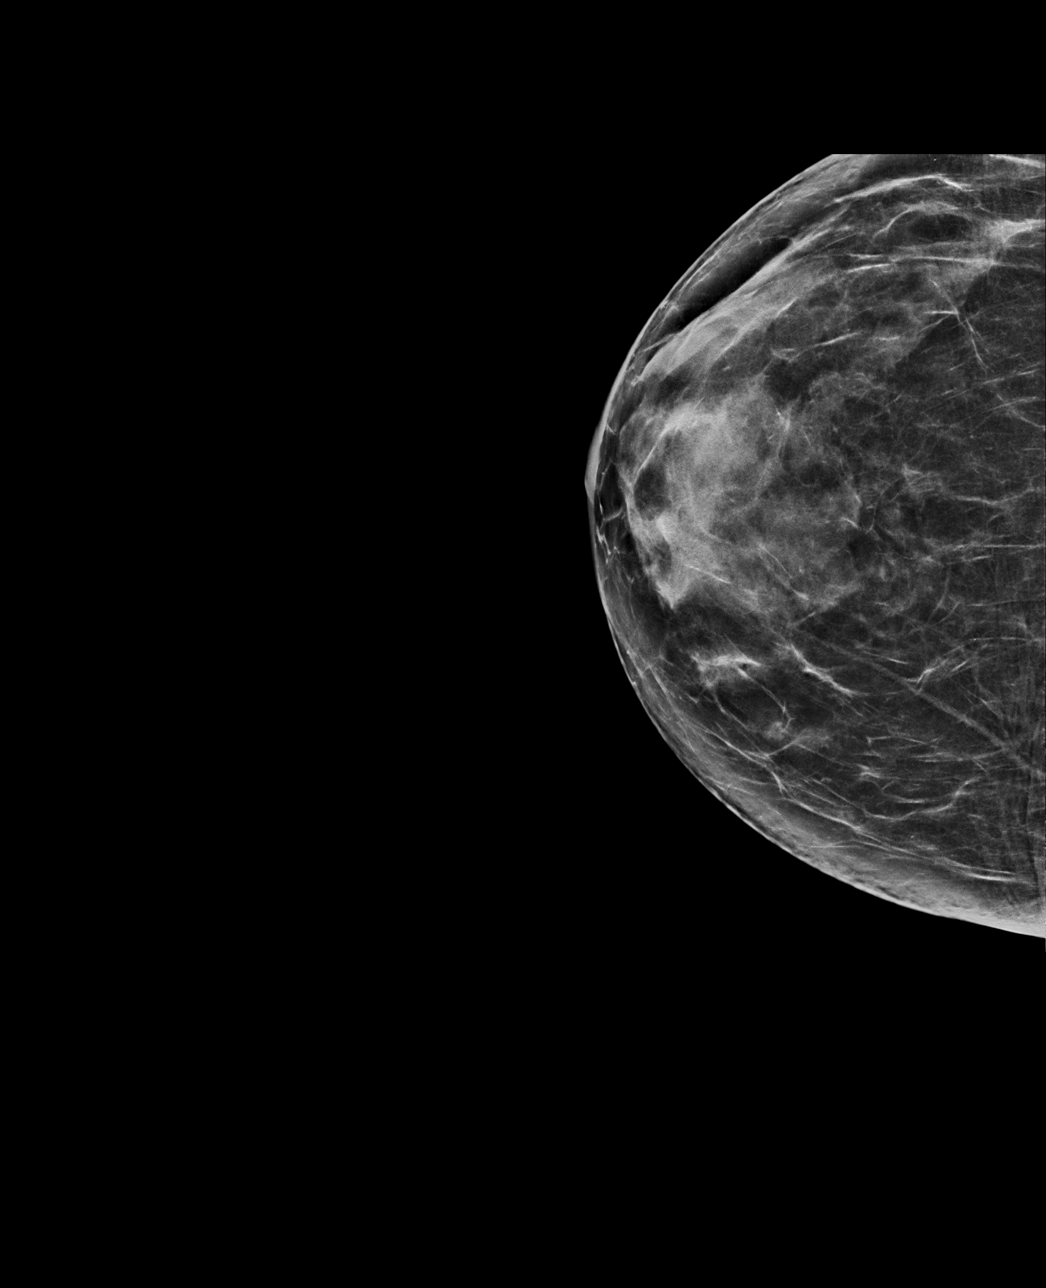

[R MLO synth-2D]
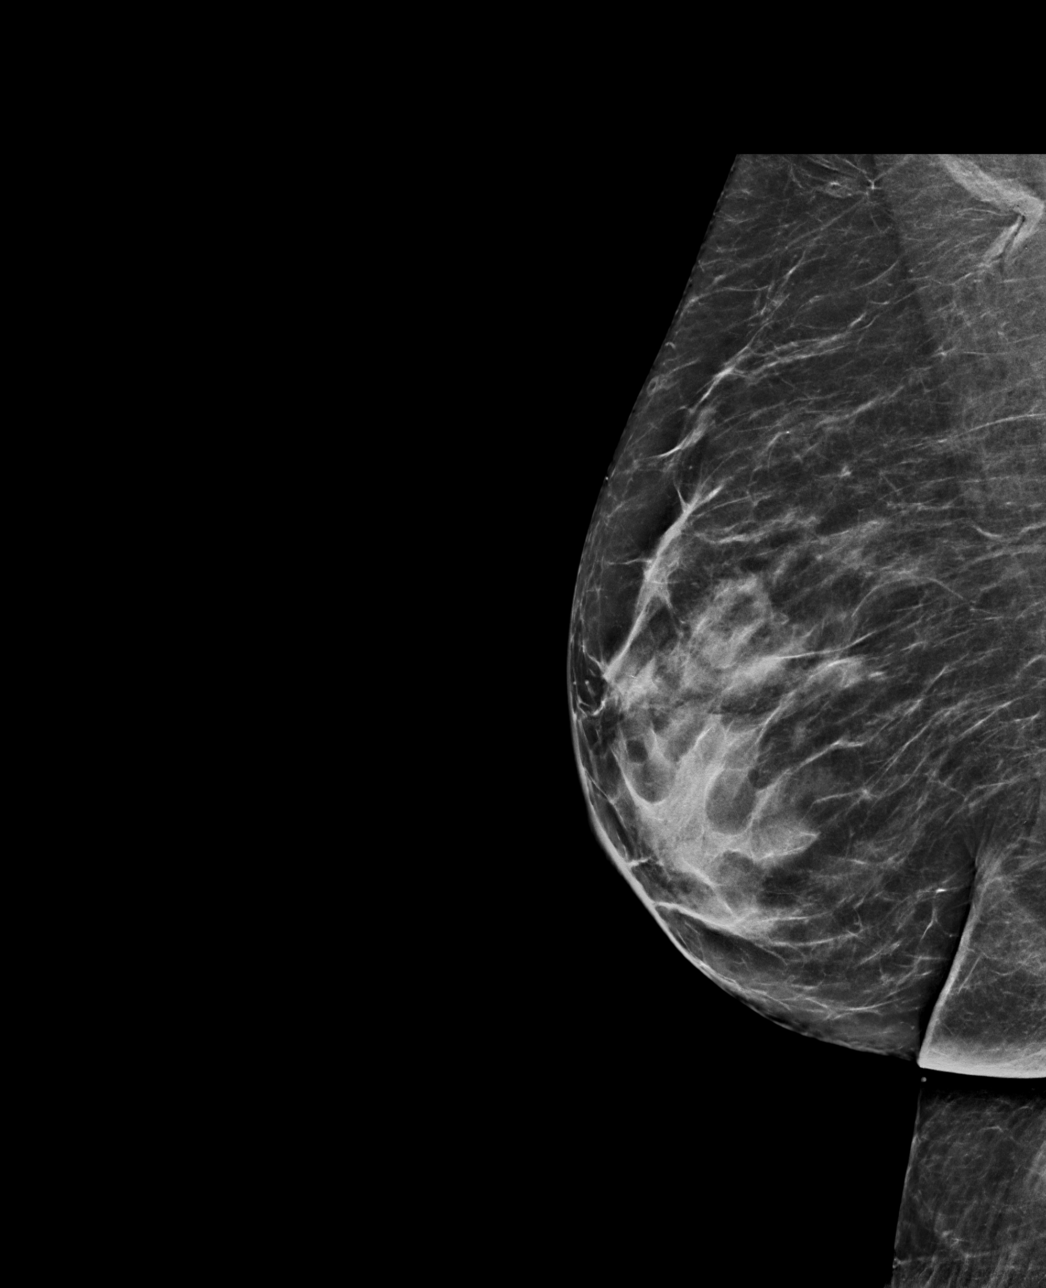

[R MLO tomo · tomo slice 33/66.0]
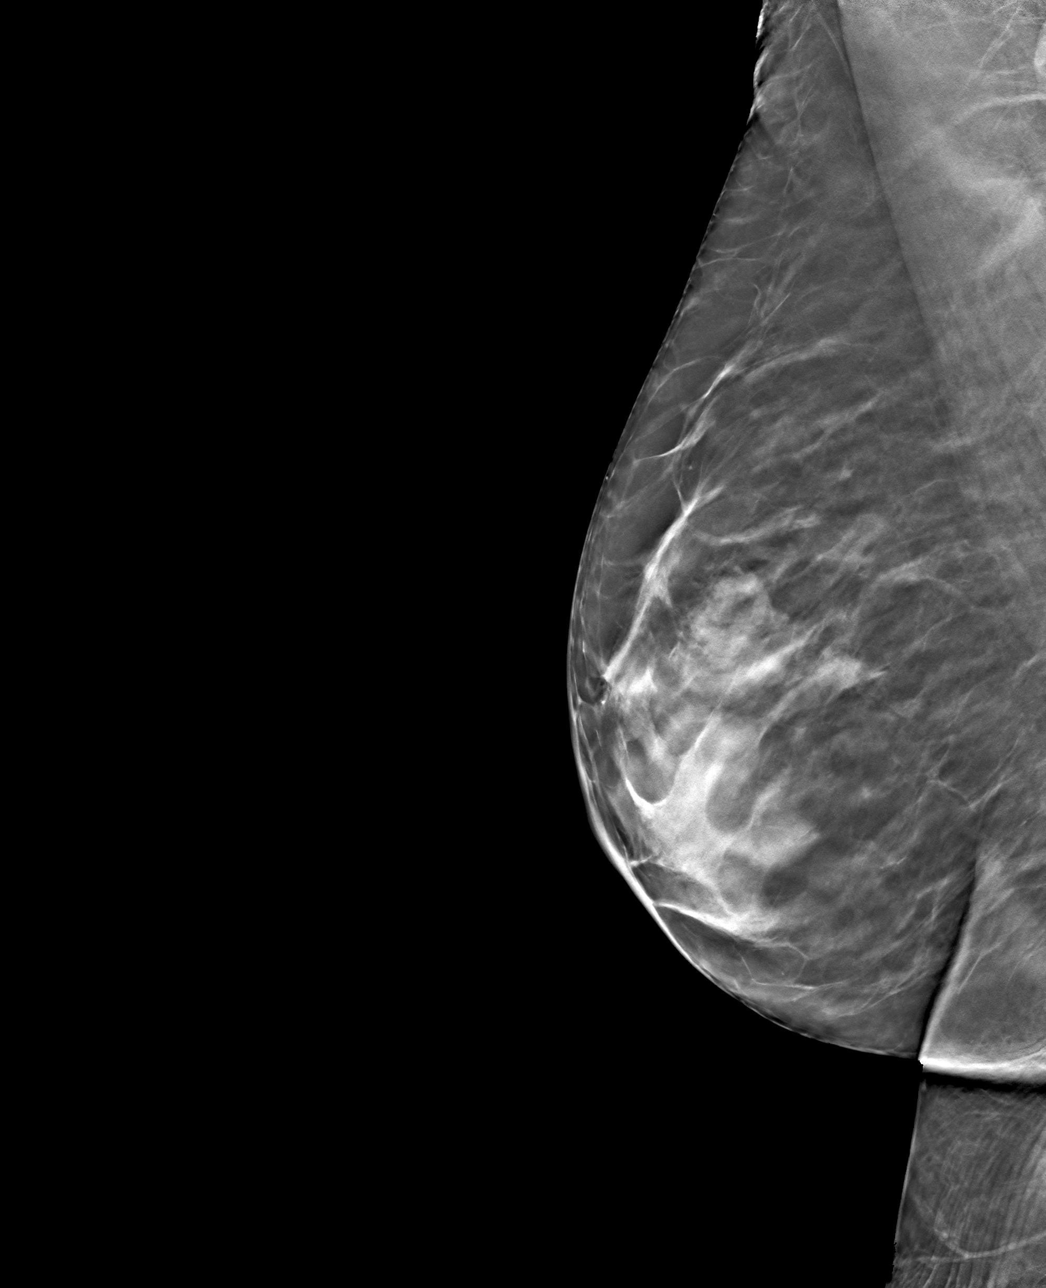

[R CC tomo · tomo slice 35/68.0]
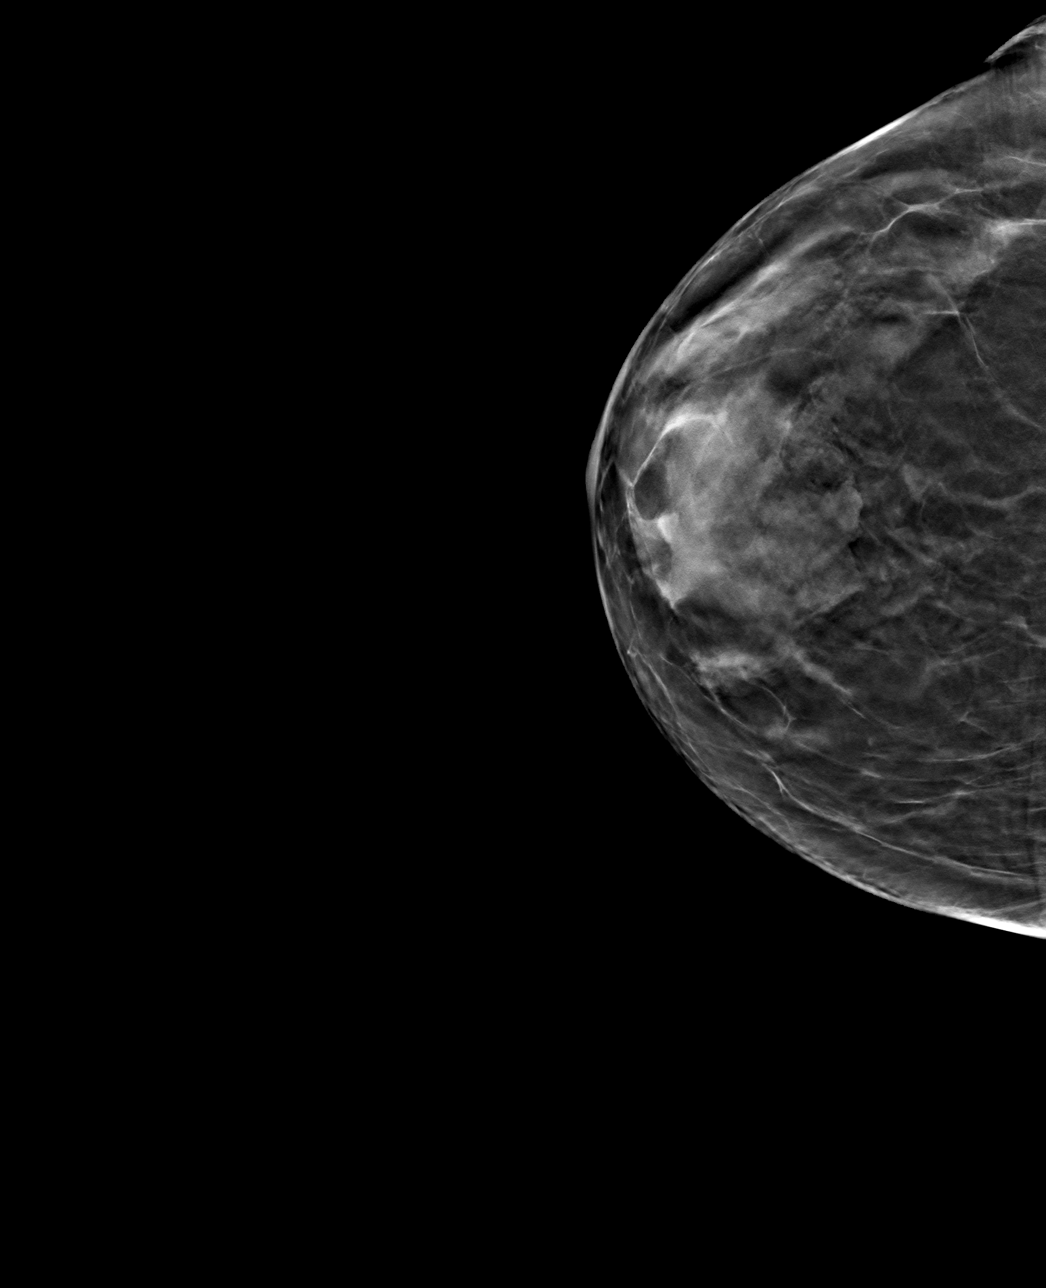

[4 of 12 positions shown; findings below may reference images not displayed]

ACR Breast Density Category c: The breast tissue is heterogeneously
dense, which may obscure small masses.
FINDINGS: The patient has had a left mastectomy. There are no findings
suspicious for malignancy.
IMPRESSION: No mammographic evidence of malignancy. A result letter of this
screening mammogram will be mailed directly to the patient.

RECOMMENDATION:
Screening mammogram in one year.  (Code:V6-Y-3H6)

BI-RADS CATEGORY  1: Negative.

## 2022-12-08 DIAGNOSIS — R3121 Asymptomatic microscopic hematuria: Secondary | ICD-10-CM | POA: Diagnosis not present

## 2022-12-08 DIAGNOSIS — N1831 Chronic kidney disease, stage 3a: Secondary | ICD-10-CM | POA: Diagnosis not present

## 2022-12-08 DIAGNOSIS — R7301 Impaired fasting glucose: Secondary | ICD-10-CM | POA: Diagnosis not present

## 2022-12-08 DIAGNOSIS — C50919 Malignant neoplasm of unspecified site of unspecified female breast: Secondary | ICD-10-CM | POA: Diagnosis not present

## 2022-12-15 DIAGNOSIS — I10 Essential (primary) hypertension: Secondary | ICD-10-CM | POA: Diagnosis not present

## 2022-12-15 DIAGNOSIS — N1831 Chronic kidney disease, stage 3a: Secondary | ICD-10-CM | POA: Diagnosis not present

## 2023-03-25 DIAGNOSIS — M25512 Pain in left shoulder: Secondary | ICD-10-CM | POA: Diagnosis not present

## 2023-04-06 DIAGNOSIS — I10 Essential (primary) hypertension: Secondary | ICD-10-CM | POA: Diagnosis not present

## 2023-04-06 DIAGNOSIS — Z79899 Other long term (current) drug therapy: Secondary | ICD-10-CM | POA: Diagnosis not present

## 2023-04-06 DIAGNOSIS — N1831 Chronic kidney disease, stage 3a: Secondary | ICD-10-CM | POA: Diagnosis not present

## 2023-04-16 DIAGNOSIS — I10 Essential (primary) hypertension: Secondary | ICD-10-CM | POA: Diagnosis not present

## 2023-04-16 DIAGNOSIS — N1831 Chronic kidney disease, stage 3a: Secondary | ICD-10-CM | POA: Diagnosis not present

## 2023-06-03 ENCOUNTER — Other Ambulatory Visit (HOSPITAL_COMMUNITY): Payer: Self-pay | Admitting: Internal Medicine

## 2023-06-03 DIAGNOSIS — Z1231 Encounter for screening mammogram for malignant neoplasm of breast: Secondary | ICD-10-CM

## 2023-06-08 ENCOUNTER — Ambulatory Visit (HOSPITAL_COMMUNITY)
Admission: RE | Admit: 2023-06-08 | Discharge: 2023-06-08 | Disposition: A | Discharge status: Home / Self Care | Payer: Medicare Other | Source: Ambulatory Visit | Attending: Internal Medicine | Admitting: Internal Medicine

## 2023-06-08 ENCOUNTER — Encounter (HOSPITAL_COMMUNITY): Payer: Self-pay

## 2023-06-08 DIAGNOSIS — Z1231 Encounter for screening mammogram for malignant neoplasm of breast: Secondary | ICD-10-CM | POA: Diagnosis not present

## 2023-08-13 DIAGNOSIS — K580 Irritable bowel syndrome with diarrhea: Secondary | ICD-10-CM | POA: Diagnosis not present

## 2023-08-13 DIAGNOSIS — Z79899 Other long term (current) drug therapy: Secondary | ICD-10-CM | POA: Diagnosis not present

## 2023-08-13 DIAGNOSIS — C50919 Malignant neoplasm of unspecified site of unspecified female breast: Secondary | ICD-10-CM | POA: Diagnosis not present

## 2023-08-13 DIAGNOSIS — R7301 Impaired fasting glucose: Secondary | ICD-10-CM | POA: Diagnosis not present

## 2023-08-13 DIAGNOSIS — M1990 Unspecified osteoarthritis, unspecified site: Secondary | ICD-10-CM | POA: Diagnosis not present

## 2023-08-13 DIAGNOSIS — N1831 Chronic kidney disease, stage 3a: Secondary | ICD-10-CM | POA: Diagnosis not present

## 2023-08-20 DIAGNOSIS — I498 Other specified cardiac arrhythmias: Secondary | ICD-10-CM | POA: Diagnosis not present

## 2023-08-20 DIAGNOSIS — M810 Age-related osteoporosis without current pathological fracture: Secondary | ICD-10-CM | POA: Diagnosis not present

## 2023-08-20 DIAGNOSIS — Z853 Personal history of malignant neoplasm of breast: Secondary | ICD-10-CM | POA: Diagnosis not present

## 2023-08-20 DIAGNOSIS — N183 Chronic kidney disease, stage 3 unspecified: Secondary | ICD-10-CM | POA: Diagnosis not present

## 2023-08-20 DIAGNOSIS — Z0001 Encounter for general adult medical examination with abnormal findings: Secondary | ICD-10-CM | POA: Diagnosis not present

## 2023-08-20 DIAGNOSIS — I10 Essential (primary) hypertension: Secondary | ICD-10-CM | POA: Diagnosis not present

## 2023-09-23 DIAGNOSIS — R350 Frequency of micturition: Secondary | ICD-10-CM | POA: Diagnosis not present

## 2023-09-30 DIAGNOSIS — N3000 Acute cystitis without hematuria: Secondary | ICD-10-CM | POA: Diagnosis not present

## 2023-12-18 DIAGNOSIS — K580 Irritable bowel syndrome with diarrhea: Secondary | ICD-10-CM | POA: Diagnosis not present

## 2023-12-18 DIAGNOSIS — N1831 Chronic kidney disease, stage 3a: Secondary | ICD-10-CM | POA: Diagnosis not present

## 2023-12-18 DIAGNOSIS — M1990 Unspecified osteoarthritis, unspecified site: Secondary | ICD-10-CM | POA: Diagnosis not present

## 2023-12-18 DIAGNOSIS — C50919 Malignant neoplasm of unspecified site of unspecified female breast: Secondary | ICD-10-CM | POA: Diagnosis not present

## 2023-12-18 DIAGNOSIS — R7301 Impaired fasting glucose: Secondary | ICD-10-CM | POA: Diagnosis not present

## 2024-01-01 DIAGNOSIS — N1831 Chronic kidney disease, stage 3a: Secondary | ICD-10-CM | POA: Diagnosis not present

## 2024-01-01 DIAGNOSIS — Z79899 Other long term (current) drug therapy: Secondary | ICD-10-CM | POA: Diagnosis not present

## 2024-01-01 DIAGNOSIS — I10 Essential (primary) hypertension: Secondary | ICD-10-CM | POA: Diagnosis not present

## 2024-05-20 ENCOUNTER — Other Ambulatory Visit (HOSPITAL_COMMUNITY): Payer: Self-pay | Admitting: Internal Medicine

## 2024-05-20 DIAGNOSIS — Z1231 Encounter for screening mammogram for malignant neoplasm of breast: Secondary | ICD-10-CM

## 2024-06-08 ENCOUNTER — Ambulatory Visit (HOSPITAL_COMMUNITY)

## 2024-06-15 ENCOUNTER — Ambulatory Visit (HOSPITAL_COMMUNITY)
Admission: RE | Admit: 2024-06-15 | Discharge: 2024-06-15 | Disposition: A | Source: Ambulatory Visit | Attending: Internal Medicine | Admitting: Internal Medicine

## 2024-06-15 DIAGNOSIS — Z1231 Encounter for screening mammogram for malignant neoplasm of breast: Secondary | ICD-10-CM
